# Patient Record
Sex: Female | Born: 1945 | Race: White | Hispanic: No | Smoking: Former smoker
Health system: Southern US, Community
[De-identification: ages and names within clinical notes are randomized; demographics above are authoritative.]

## PROBLEM LIST (undated history)

## (undated) DIAGNOSIS — E119 Type 2 diabetes mellitus without complications: Secondary | ICD-10-CM

## (undated) DIAGNOSIS — J449 Chronic obstructive pulmonary disease, unspecified: Secondary | ICD-10-CM

## (undated) DIAGNOSIS — C801 Malignant (primary) neoplasm, unspecified: Secondary | ICD-10-CM

## (undated) DIAGNOSIS — E785 Hyperlipidemia, unspecified: Secondary | ICD-10-CM

## (undated) DIAGNOSIS — F419 Anxiety disorder, unspecified: Secondary | ICD-10-CM

## (undated) DIAGNOSIS — R0600 Dyspnea, unspecified: Secondary | ICD-10-CM

## (undated) DIAGNOSIS — K219 Gastro-esophageal reflux disease without esophagitis: Secondary | ICD-10-CM

## (undated) DIAGNOSIS — I10 Essential (primary) hypertension: Secondary | ICD-10-CM

## (undated) HISTORY — PX: VAGINAL HYSTERECTOMY: SUR661

## (undated) HISTORY — DX: Anxiety disorder, unspecified: F41.9

## (undated) HISTORY — DX: Hyperlipidemia, unspecified: E78.5

## (undated) HISTORY — PX: CYSTOCELE REPAIR: SHX163

## (undated) HISTORY — DX: Essential (primary) hypertension: I10

## (undated) HISTORY — DX: Type 2 diabetes mellitus without complications: E11.9

---

## 1969-08-09 DIAGNOSIS — C801 Malignant (primary) neoplasm, unspecified: Secondary | ICD-10-CM

## 1969-08-09 HISTORY — PX: OOPHORECTOMY: SHX86

## 1969-08-09 HISTORY — DX: Malignant (primary) neoplasm, unspecified: C80.1

## 2004-09-16 ENCOUNTER — Emergency Department: Payer: Self-pay | Admitting: Emergency Medicine

## 2005-05-20 ENCOUNTER — Ambulatory Visit: Payer: Self-pay | Admitting: Family Medicine

## 2005-12-15 ENCOUNTER — Emergency Department: Payer: Self-pay | Admitting: Emergency Medicine

## 2006-05-03 ENCOUNTER — Ambulatory Visit: Payer: Self-pay | Admitting: Family Medicine

## 2009-08-09 HISTORY — PX: BREAST BIOPSY: SHX20

## 2009-12-30 ENCOUNTER — Ambulatory Visit: Payer: Self-pay | Admitting: Family Medicine

## 2011-03-29 ENCOUNTER — Ambulatory Visit: Payer: Self-pay | Admitting: Vascular Surgery

## 2011-12-01 ENCOUNTER — Ambulatory Visit: Payer: Self-pay | Admitting: Family Medicine

## 2012-06-12 ENCOUNTER — Ambulatory Visit: Payer: Self-pay | Admitting: Family Medicine

## 2012-12-28 ENCOUNTER — Ambulatory Visit: Payer: Self-pay | Admitting: Family Medicine

## 2013-01-31 ENCOUNTER — Ambulatory Visit: Payer: Self-pay | Admitting: Neurology

## 2013-04-08 ENCOUNTER — Ambulatory Visit: Payer: Self-pay | Admitting: Family Medicine

## 2013-04-10 ENCOUNTER — Ambulatory Visit: Payer: Self-pay | Admitting: Family Medicine

## 2013-06-25 DIAGNOSIS — Z961 Presence of intraocular lens: Secondary | ICD-10-CM | POA: Insufficient documentation

## 2013-06-25 DIAGNOSIS — G609 Hereditary and idiopathic neuropathy, unspecified: Secondary | ICD-10-CM | POA: Insufficient documentation

## 2013-06-25 DIAGNOSIS — H409 Unspecified glaucoma: Secondary | ICD-10-CM | POA: Insufficient documentation

## 2013-06-25 DIAGNOSIS — E785 Hyperlipidemia, unspecified: Secondary | ICD-10-CM | POA: Insufficient documentation

## 2013-06-25 DIAGNOSIS — C55 Malignant neoplasm of uterus, part unspecified: Secondary | ICD-10-CM | POA: Insufficient documentation

## 2014-02-22 ENCOUNTER — Ambulatory Visit: Payer: Self-pay | Admitting: Family Medicine

## 2014-03-15 ENCOUNTER — Ambulatory Visit: Payer: Self-pay | Admitting: Gastroenterology

## 2014-03-19 LAB — PATHOLOGY REPORT

## 2014-04-03 ENCOUNTER — Ambulatory Visit: Payer: Self-pay | Admitting: Gastroenterology

## 2014-04-11 ENCOUNTER — Ambulatory Visit: Payer: Self-pay | Admitting: Emergency Medicine

## 2014-10-17 ENCOUNTER — Ambulatory Visit: Payer: Self-pay | Admitting: Gastroenterology

## 2014-10-17 HISTORY — PX: COLONOSCOPY: SHX174

## 2015-04-30 DIAGNOSIS — L301 Dyshidrosis [pompholyx]: Secondary | ICD-10-CM | POA: Insufficient documentation

## 2015-05-08 ENCOUNTER — Other Ambulatory Visit: Payer: Self-pay | Admitting: Family Medicine

## 2015-05-08 DIAGNOSIS — Z1231 Encounter for screening mammogram for malignant neoplasm of breast: Secondary | ICD-10-CM

## 2015-05-09 ENCOUNTER — Other Ambulatory Visit: Payer: Self-pay | Admitting: Family Medicine

## 2015-05-09 ENCOUNTER — Ambulatory Visit
Admission: RE | Admit: 2015-05-09 | Discharge: 2015-05-09 | Disposition: A | Discharge status: Home / Self Care | Payer: Medicare HMO | Source: Ambulatory Visit | Attending: Family Medicine | Admitting: Family Medicine

## 2015-05-09 DIAGNOSIS — Z1231 Encounter for screening mammogram for malignant neoplasm of breast: Secondary | ICD-10-CM | POA: Insufficient documentation

## 2015-06-16 ENCOUNTER — Ambulatory Visit (INDEPENDENT_AMBULATORY_CARE_PROVIDER_SITE_OTHER): Payer: Medicare HMO | Admitting: Family Medicine

## 2015-06-16 ENCOUNTER — Encounter: Payer: Self-pay | Admitting: Family Medicine

## 2015-06-16 VITALS — BP 140/70 | HR 72 | Ht 65.0 in | Wt 190.0 lb

## 2015-06-16 DIAGNOSIS — E119 Type 2 diabetes mellitus without complications: Secondary | ICD-10-CM | POA: Diagnosis not present

## 2015-06-16 DIAGNOSIS — F419 Anxiety disorder, unspecified: Secondary | ICD-10-CM | POA: Diagnosis not present

## 2015-06-16 DIAGNOSIS — M1990 Unspecified osteoarthritis, unspecified site: Secondary | ICD-10-CM | POA: Diagnosis not present

## 2015-06-16 DIAGNOSIS — Z7189 Other specified counseling: Secondary | ICD-10-CM

## 2015-06-16 DIAGNOSIS — E785 Hyperlipidemia, unspecified: Secondary | ICD-10-CM

## 2015-06-16 DIAGNOSIS — I10 Essential (primary) hypertension: Secondary | ICD-10-CM

## 2015-06-16 DIAGNOSIS — Z7689 Persons encountering health services in other specified circumstances: Secondary | ICD-10-CM

## 2015-06-16 MED ORDER — LOSARTAN POTASSIUM 100 MG PO TABS: 100.0000 mg | ORAL_TABLET | Freq: Every day | ORAL | Status: DC

## 2015-06-16 MED ORDER — FENOFIBRATE 160 MG PO TABS: 160.0000 mg | ORAL_TABLET | Freq: Every day | ORAL | Status: DC

## 2015-06-16 MED ORDER — LORAZEPAM 0.5 MG PO TABS: 0.5000 mg | ORAL_TABLET | Freq: Every day | ORAL | Status: DC

## 2015-06-16 MED ORDER — OMEGA-3 300 MG PO CAPS: 1.0000 | ORAL_CAPSULE | Freq: Every day | ORAL | Status: DC

## 2015-06-16 MED ORDER — AMLODIPINE BESYLATE 10 MG PO TABS: 10.0000 mg | ORAL_TABLET | Freq: Every day | ORAL | Status: DC

## 2015-06-16 MED ORDER — LOVASTATIN 40 MG PO TABS: 40.0000 mg | ORAL_TABLET | Freq: Every day | ORAL | Status: DC

## 2015-06-16 MED ORDER — CARVEDILOL 25 MG PO TABS: 25.0000 mg | ORAL_TABLET | Freq: Two times a day (BID) | ORAL | Status: DC

## 2015-06-16 MED ORDER — GLIPIZIDE ER 2.5 MG PO TB24: 2.5000 mg | ORAL_TABLET | Freq: Every day | ORAL | Status: DC

## 2015-06-16 MED ORDER — METFORMIN HCL 1000 MG PO TABS: 1000.0000 mg | ORAL_TABLET | Freq: Two times a day (BID) | ORAL | Status: DC

## 2015-06-16 NOTE — Progress Notes (Signed)
Name: Sara Atkinson   MRN: [id number]    DOB: 08/19/1945   Date:06/16/2015       Progress Note  Subjective  Chief Complaint  Chief Complaint  Patient presents with  . Establish Care  . Diabetes    diarrhea not better since Dr cut Glipizide in half- Last A1C- 6.8    Diabetes She presents for her follow-up diabetic visit. She has type 2 diabetes mellitus. Her disease course has been stable. Hypoglycemia symptoms include nervousness/anxiousness. Pertinent negatives for hypoglycemia include no dizziness or headaches. There are no diabetic associated symptoms. Pertinent negatives for diabetes include no blurred vision, no chest pain, no polydipsia and no weight loss. There are no hypoglycemic complications. Symptoms are stable. There are no diabetic complications. Pertinent negatives for diabetic complications include no CVA, PVD or retinopathy. Current diabetic treatment includes oral agent (dual therapy). She is compliant with treatment most of the time. Her weight is stable. She is following a generally healthy diet. She participates in exercise intermittently. There is no change in her home blood glucose trend. Her breakfast blood glucose is taken between 8-9 am. Her breakfast blood glucose range is generally 130-140 mg/dl. An ACE inhibitor/angiotensin II receptor blocker is being taken. She does not see a podiatrist.Eye exam is current.  Hypertension This is a chronic problem. The current episode started more than 1 year ago. The problem has been waxing and waning since onset. The problem is controlled. Associated symptoms include anxiety. Pertinent negatives include no blurred vision, chest pain, headaches, malaise/fatigue, neck pain, orthopnea, palpitations, peripheral edema, PND or shortness of breath. There are no associated agents to hypertension. There are no known risk factors for coronary artery disease. Past treatments include calcium channel blockers and angiotensin blockers. The current  treatment provides moderate improvement. There are no compliance problems.  There is no history of angina, kidney disease, CAD/MI, CVA, heart failure, left ventricular hypertrophy, PVD or retinopathy. There is no history of chronic renal disease or a hypertension causing med.  Hyperlipidemia This is a chronic problem. The current episode started more than 1 year ago. The problem is controlled. Recent lipid tests were reviewed and are low. She has no history of chronic renal disease, diabetes, hypothyroidism, liver disease, obesity or nephrotic syndrome. There are no known factors aggravating her hyperlipidemia. Pertinent negatives include no chest pain, focal sensory loss, focal weakness, leg pain, myalgias or shortness of breath. The current treatment provides moderate improvement of lipids. There are no compliance problems.  There are no known risk factors for coronary artery disease.  Leg Pain  The incident occurred more than 1 week ago. There was no injury mechanism. The pain is present in the left hip, right hip, right knee and left knee. The quality of the pain is described as aching. Pertinent negatives include no inability to bear weight, loss of sensation or tingling. She has tried acetaminophen and NSAIDs for the symptoms. The treatment provided moderate relief.  Anxiety Onset was more than 5 years ago. Symptoms include insomnia and nervous/anxious behavior. Patient reports no chest pain, dizziness, feeling of choking, irritability, nausea, palpitations, shortness of breath or suicidal ideas. Symptoms occur occasionally. The severity of symptoms is mild. Nothing aggravates the symptoms.   There is no history of anemia, anxiety/panic attacks, arrhythmia, asthma, bipolar disorder, CAD, CHF, chronic lung disease, depression, fibromyalgia, hyperthyroidism or suicide attempts. Past treatments include benzodiazephines. Compliance with prior treatments has been good.    No problem-specific assessment &  plan notes found for  this encounter.   Past Medical History  Diagnosis Date  . Diabetes mellitus without complication ([place])   . Hypertension   . Hyperlipidemia   . Anxiety     Past Surgical History  Procedure Laterality Date  . Vaginal hysterectomy    . Cystocele repair    . Colonoscopy  10/17/2014    cleared for 3 years/ polyps- Dr Allen Norris    Family History  Problem Relation Age of Onset  . Cancer Mother   . Diabetes Mother   . Diabetes Father   . Diabetes Maternal Grandfather   . Stroke Maternal Grandfather   . Diabetes Paternal Grandfather     Social History   Social History  . Marital Status: Widowed    Spouse Name: N/A  . Number of Children: N/A  . Years of Education: N/A   Occupational History  . Not on file.   Social History Main Topics  . Smoking status: Former Research scientist (life sciences)  . Smokeless tobacco: Not on file  . Alcohol Use: 0.0 oz/week    0 Standard drinks or equivalent per week  . Drug Use: No  . Sexual Activity: No   Other Topics Concern  . Not on file   Social History Narrative    Allergies  Allergen Reactions  . Metronidazole Anaphylaxis  . Penicillins Anaphylaxis and Hives  . Tetracyclines & Related Hives     Review of Systems  Constitutional: Negative for fever, chills, weight loss, malaise/fatigue and irritability.  HENT: Negative for ear discharge, ear pain and sore throat.   Eyes: Negative for blurred vision.  Respiratory: Negative for cough, sputum production, shortness of breath and wheezing.   Cardiovascular: Negative for chest pain, palpitations, orthopnea, leg swelling and PND.  Gastrointestinal: Negative for heartburn, nausea, abdominal pain, diarrhea, constipation, blood in stool and melena.  Genitourinary: Negative for dysuria, urgency, frequency and hematuria.  Musculoskeletal: Positive for back pain and joint pain. Negative for myalgias and neck pain.  Skin: Negative for rash.  Neurological: Negative for dizziness, tingling,  sensory change, focal weakness and headaches.  Endo/Heme/Allergies: Negative for environmental allergies and polydipsia. Does not bruise/bleed easily.  Psychiatric/Behavioral: Negative for depression and suicidal ideas. The patient is nervous/anxious and has insomnia.      Objective  Filed Vitals:   06/16/15 1439  BP: 140/70  Pulse: 72  Height: 5\' 5"  (1.651 m)  Weight: 190 lb (86.183 kg)    Physical Exam  Constitutional: She is well-developed, well-nourished, and in no distress. No distress.  HENT:  Head: Normocephalic and atraumatic.  Right Ear: External ear normal.  Left Ear: External ear normal.  Nose: Nose normal.  Mouth/Throat: Oropharynx is clear and moist.  Eyes: Conjunctivae and EOM are normal. Pupils are equal, round, and reactive to light. Right eye exhibits no discharge. Left eye exhibits no discharge.  Neck: Normal range of motion. Neck supple. No JVD present. No thyromegaly present.  Cardiovascular: Normal rate, regular rhythm, normal heart sounds and intact distal pulses.  Exam reveals no gallop and no friction rub.   No murmur heard. Pulmonary/Chest: Effort normal and breath sounds normal.  Abdominal: Soft. Bowel sounds are normal. She exhibits no mass. There is no tenderness. There is no guarding.  Musculoskeletal: Normal range of motion. She exhibits no edema.  Lymphadenopathy:    She has no cervical adenopathy.  Neurological: She is alert. She has normal reflexes.  Skin: Skin is warm and dry. She is not diaphoretic.  Psychiatric: Mood and affect normal.  Assessment & Plan  Problem List Items Addressed This Visit    None    Visit Diagnoses    Encounter to establish care with new doctor    -  Primary    Type 2 diabetes mellitus without complication, without long-term current use of insulin ([facility])        Relevant Medications    aspirin 81 MG tablet    glipiZIDE (GLUCOTROL XL) 2.5 MG 24 hr tablet    losartan (COZAAR) 100 MG tablet    lovastatin  (MEVACOR) 40 MG tablet    metFORMIN (GLUCOPHAGE) 1000 MG tablet    Essential hypertension        Relevant Medications    aspirin 81 MG tablet    amLODipine (NORVASC) 10 MG tablet    carvedilol (COREG) 25 MG tablet    fenofibrate 160 MG tablet    losartan (COZAAR) 100 MG tablet    lovastatin (MEVACOR) 40 MG tablet    Hyperlipidemia        Relevant Medications    aspirin 81 MG tablet    amLODipine (NORVASC) 10 MG tablet    carvedilol (COREG) 25 MG tablet    fenofibrate 160 MG tablet    losartan (COZAAR) 100 MG tablet    lovastatin (MEVACOR) 40 MG tablet    Omega-3 300 MG CAPS    Osteoarthritis, unspecified osteoarthritis type, unspecified site        Relevant Medications    naproxen (NAPROSYN) 500 MG tablet    aspirin 81 MG tablet    Chronic anxiety        Relevant Medications    LORazepam (ATIVAN) 0.5 MG tablet       More than 45 minutes was spent counseling the patient on medication use and the dangers of prescribing long term anxiety medications. Also discussing her current issues with diabetic medication. I went over her bone density scan that was ordered and performed by her last doctor- pt was informed to continue taking Vitamin D and repeat scan in 2 years.  Dr. [facility]  06/16/2015

## 2015-08-15 DIAGNOSIS — H04123 Dry eye syndrome of bilateral lacrimal glands: Secondary | ICD-10-CM | POA: Diagnosis not present

## 2015-08-15 DIAGNOSIS — H40003 Preglaucoma, unspecified, bilateral: Secondary | ICD-10-CM | POA: Diagnosis not present

## 2015-08-28 ENCOUNTER — Ambulatory Visit (INDEPENDENT_AMBULATORY_CARE_PROVIDER_SITE_OTHER): Payer: Medicare Other | Admitting: Family Medicine

## 2015-08-28 ENCOUNTER — Encounter: Payer: Self-pay | Admitting: Family Medicine

## 2015-08-28 VITALS — BP 120/64 | HR 72 | Temp 98.4°F | Ht 65.0 in | Wt 190.0 lb

## 2015-08-28 DIAGNOSIS — J0100 Acute maxillary sinusitis, unspecified: Secondary | ICD-10-CM

## 2015-08-28 DIAGNOSIS — J40 Bronchitis, not specified as acute or chronic: Secondary | ICD-10-CM

## 2015-08-28 MED ORDER — AZITHROMYCIN 250 MG PO TABS: ORAL_TABLET | ORAL | Status: DC

## 2015-08-28 MED ORDER — GUAIFENESIN-CODEINE 100-10 MG/5ML PO SOLN: 5.0000 mL | Freq: Three times a day (TID) | ORAL | Status: DC | PRN

## 2015-08-28 NOTE — Progress Notes (Signed)
Name: Sara Atkinson   MRN: [id number]    DOB: 1946-01-06   Date:08/28/2015       Progress Note  Subjective  Chief Complaint  Chief Complaint  Patient presents with  . Sinusitis    cough and cong- head pressure    Sinusitis This is a new problem. The current episode started in the past 7 days. The problem has been gradually worsening since onset. There has been no fever. She is experiencing no pain. Associated symptoms include congestion, coughing, shortness of breath, sinus pressure, sneezing and a sore throat. Pertinent negatives include no chills, diaphoresis, ear pain, headaches or neck pain. Treatments tried: expectorant/ dm. The treatment provided mild relief.  Cough This is a new problem. The current episode started in the past 7 days. The problem has been gradually worsening. The cough is productive of sputum and productive of purulent sputum (yellow tint). Associated symptoms include postnasal drip, a sore throat and shortness of breath. Pertinent negatives include no chest pain, chills, ear congestion, ear pain, fever, headaches, heartburn, myalgias, rash, weight loss or wheezing. She has tried OTC cough suppressant for the symptoms. The treatment provided no relief. There is no history of environmental allergies.    No problem-specific assessment & plan notes found for this encounter.   Past Medical History  Diagnosis Date  . Diabetes mellitus without complication ([place])   . Hypertension   . Hyperlipidemia   . Anxiety     Past Surgical History  Procedure Laterality Date  . Vaginal hysterectomy    . Cystocele repair    . Colonoscopy  10/17/2014    cleared for 3 years/ polyps- Dr Allen Norris    Family History  Problem Relation Age of Onset  . Cancer Mother   . Diabetes Mother   . Diabetes Father   . Diabetes Maternal Grandfather   . Stroke Maternal Grandfather   . Diabetes Paternal Grandfather     Social History   Social History  . Marital Status: Widowed    Spouse  Name: N/A  . Number of Children: N/A  . Years of Education: N/A   Occupational History  . Not on file.   Social History Main Topics  . Smoking status: Former Research scientist (life sciences)  . Smokeless tobacco: Not on file  . Alcohol Use: 0.0 oz/week    0 Standard drinks or equivalent per week  . Drug Use: No  . Sexual Activity: No   Other Topics Concern  . Not on file   Social History Narrative    Allergies  Allergen Reactions  . Metronidazole Anaphylaxis  . Penicillins Anaphylaxis and Hives  . Tetracyclines & Related Hives     Review of Systems  Constitutional: Negative for fever, chills, weight loss, malaise/fatigue and diaphoresis.  HENT: Positive for congestion, postnasal drip, sinus pressure, sneezing and sore throat. Negative for ear discharge and ear pain.   Eyes: Negative for blurred vision.  Respiratory: Positive for cough and shortness of breath. Negative for sputum production and wheezing.   Cardiovascular: Negative for chest pain, palpitations and leg swelling.  Gastrointestinal: Negative for heartburn, nausea, abdominal pain, diarrhea, constipation, blood in stool and melena.  Genitourinary: Negative for dysuria, urgency, frequency and hematuria.  Musculoskeletal: Negative for myalgias, back pain, joint pain and neck pain.  Skin: Negative for rash.  Neurological: Negative for dizziness, tingling, sensory change, focal weakness and headaches.  Endo/Heme/Allergies: Negative for environmental allergies and polydipsia. Does not bruise/bleed easily.  Psychiatric/Behavioral: Negative for depression and suicidal ideas. The patient  is not nervous/anxious and does not have insomnia.      Objective  Filed Vitals:   08/28/15 1523  BP: 120/64  Pulse: 72  Temp: 98.4 F (36.9 C)  TempSrc: Oral  Height: 5\' 5"  (1.651 m)  Weight: 190 lb (86.183 kg)    Physical Exam  Constitutional: She is well-developed, well-nourished, and in no distress. No distress.  HENT:  Head: Normocephalic  and atraumatic.  Right Ear: External ear normal.  Left Ear: External ear normal.  Nose: Nose normal.  Mouth/Throat: Oropharynx is clear and moist.  Eyes: Conjunctivae and EOM are normal. Pupils are equal, round, and reactive to light. Right eye exhibits no discharge. Left eye exhibits no discharge.  Neck: Normal range of motion. Neck supple. No JVD present. No thyromegaly present.  Cardiovascular: Normal rate, regular rhythm, normal heart sounds and intact distal pulses.  Exam reveals no gallop and no friction rub.   No murmur heard. Pulmonary/Chest: Effort normal and breath sounds normal.  Abdominal: Soft. Bowel sounds are normal. She exhibits no mass. There is no tenderness. There is no guarding.  Musculoskeletal: Normal range of motion. She exhibits no edema.  Lymphadenopathy:    She has no cervical adenopathy.  Neurological: She is alert.  Skin: Skin is warm and dry. She is not diaphoretic.  Psychiatric: Mood and affect normal.      Assessment & Plan  Problem List Items Addressed This Visit    None    Visit Diagnoses    Acute maxillary sinusitis, recurrence not specified    -  Primary    Relevant Medications    azithromycin (ZITHROMAX) 250 MG tablet    guaiFENesin-codeine 100-10 MG/5ML syrup    Bronchitis        Relevant Medications    azithromycin (ZITHROMAX) 250 MG tablet    guaiFENesin-codeine 100-10 MG/5ML syrup         Dr. Jerrett [facility]  08/28/2015

## 2015-10-14 ENCOUNTER — Ambulatory Visit (INDEPENDENT_AMBULATORY_CARE_PROVIDER_SITE_OTHER): Payer: Medicare Other | Admitting: Family Medicine

## 2015-10-14 ENCOUNTER — Encounter: Payer: Self-pay | Admitting: Family Medicine

## 2015-10-14 VITALS — BP 124/80 | HR 80 | Ht 65.0 in | Wt 190.0 lb

## 2015-10-14 DIAGNOSIS — Z0000 Encounter for general adult medical examination without abnormal findings: Secondary | ICD-10-CM

## 2015-10-14 DIAGNOSIS — E119 Type 2 diabetes mellitus without complications: Secondary | ICD-10-CM

## 2015-10-14 DIAGNOSIS — Z23 Encounter for immunization: Secondary | ICD-10-CM

## 2015-10-14 MED ORDER — GLIPIZIDE ER 5 MG PO TB24: 5.0000 mg | ORAL_TABLET | Freq: Every day | ORAL | Status: DC

## 2015-10-14 NOTE — Progress Notes (Signed)
Patient: Sara Atkinson, Female    DOB: [birth year], 70 y.o.   MRN: [id number] Visit Date: 10/14/2015  Today's Provider: Otilio Miu, MD   Chief Complaint  Patient presents with  . Annual Exam    medicare annual wellness   Subjective:   Initial preventative physical exam Sara Atkinson is a 70 y.o. female who presents today for her Initial Preventative Physical Exam. She feels well. She reports exercising daily walking. She reports she is sleeping fairly well.  HPI Comments: Patient presents for Medicare annual Wellness.    Review of Systems  Constitutional: Negative.  Negative for fever, chills, fatigue and unexpected weight change.  HENT: Negative for congestion, ear discharge, ear pain, rhinorrhea, sinus pressure, sneezing and sore throat.   Eyes: Negative for photophobia, pain, discharge, redness and itching.  Respiratory: Negative for cough, shortness of breath, wheezing and stridor.   Gastrointestinal: Negative for nausea, vomiting, abdominal pain, diarrhea, constipation and blood in stool.  Endocrine: Negative for cold intolerance, heat intolerance, polydipsia, polyphagia and polyuria.  Genitourinary: Negative for dysuria, urgency, frequency, hematuria, flank pain, vaginal bleeding, vaginal discharge, menstrual problem and pelvic pain.  Musculoskeletal: Negative for myalgias, back pain and arthralgias.  Skin: Negative for rash.  Allergic/Immunologic: Negative for environmental allergies and food allergies.  Neurological: Negative for dizziness, weakness, light-headedness, numbness and headaches.  Hematological: Negative for adenopathy. Does not bruise/bleed easily.  Psychiatric/Behavioral: Negative for dysphoric mood. The patient is not nervous/anxious.     Social History   Social History  . Marital Status: Widowed    Spouse Name: N/A  . Number of Children: N/A  . Years of Education: N/A   Occupational History  . Not on file.   Social History Main Topics  . Smoking  status: Former Research scientist (life sciences)  . Smokeless tobacco: Not on file  . Alcohol Use: 0.0 oz/week    0 Standard drinks or equivalent per week  . Drug Use: No  . Sexual Activity: No   Other Topics Concern  . Not on file   Social History Narrative    There are no active problems to display for this patient.   Past Surgical History  Procedure Laterality Date  . Vaginal hysterectomy    . Cystocele repair    . Colonoscopy  10/17/2014    cleared for 3 years/ polyps- Dr Allen Norris    Her family history includes Cancer in her mother; Diabetes in her father, maternal grandfather, mother, and paternal grandfather; Stroke in her maternal grandfather.    Previous Medications   AMLODIPINE (NORVASC) 10 MG TABLET    Take 1 tablet (10 mg total) by mouth daily.   ASPIRIN 81 MG TABLET    Take 81 mg by mouth daily.   BIOTIN 5000 PO    Take 1 capsule by mouth daily.   CARVEDILOL (COREG) 25 MG TABLET    Take 1 tablet (25 mg total) by mouth 2 (two) times daily.   CHOLECALCIFEROL (VITAMIN D3) 5000 UNITS CAPS    Take 1 capsule by mouth daily.   FENOFIBRATE 160 MG TABLET    Take 1 tablet (160 mg total) by mouth daily.   LORAZEPAM (ATIVAN) 0.5 MG TABLET    Take 1 tablet (0.5 mg total) by mouth daily. As needed. #25 per 60 days   LOSARTAN (COZAAR) 100 MG TABLET    Take 1 tablet (100 mg total) by mouth daily.   LOVASTATIN (MEVACOR) 40 MG TABLET    Take 1 tablet (40 mg total) by mouth daily.  METFORMIN (GLUCOPHAGE) 1000 MG TABLET    Take 1 tablet (1,000 mg total) by mouth 2 (two) times daily.   NAPROXEN (NAPROSYN) 500 MG TABLET    Take 500 mg by mouth as needed.   OMEGA-3 300 MG CAPS    Take 1 capsule by mouth daily.    Patient Care Team: Juline Patch, MD as PCP - General (Family Medicine)     Objective:   Vitals: BP 124/80 mmHg  Pulse 80  Ht 5\' 5"  (1.651 m)  Wt 190 lb (86.183 kg)  BMI 31.62 kg/m2  Physical Exam  Constitutional: She is oriented to person, place, and time. She appears well-developed and  well-nourished.  HENT:  Head: Normocephalic.  Right Ear: External ear normal.  Left Ear: External ear normal.  Mouth/Throat: Oropharynx is clear and moist.  Eyes: Conjunctivae and EOM are normal. Pupils are equal, round, and reactive to light. Lids are everted and swept, no foreign bodies found. Left eye exhibits no hordeolum. No foreign body present in the left eye. Right conjunctiva is not injected. Left conjunctiva is not injected. No scleral icterus.  Neck: Normal range of motion. Neck supple. No JVD present. No tracheal deviation present. No thyromegaly present.  Cardiovascular: Normal rate, regular rhythm, normal heart sounds and intact distal pulses.  Exam reveals no gallop and no friction rub.   No murmur heard. Pulmonary/Chest: Effort normal and breath sounds normal. No respiratory distress. She has no wheezes. She has no rales.  Abdominal: Soft. Bowel sounds are normal. She exhibits no mass. There is no hepatosplenomegaly. There is no tenderness. There is no rebound and no guarding.  Musculoskeletal: Normal range of motion. She exhibits no edema or tenderness.  Lymphadenopathy:    She has no cervical adenopathy.  Neurological: She is alert and oriented to person, place, and time. She has normal strength. She displays normal reflexes. No cranial nerve deficit.  Skin: Skin is warm. No rash noted.  Psychiatric: She has a normal mood and affect. Thought content normal. Her mood appears not anxious. She does not exhibit a depressed mood.  Nursing note and vitals reviewed.    No exam data present  Activities of Daily Living In your present state of health, do you have any difficulty performing the following activities: 10/14/2015 08/28/2015  Hearing? N N  Vision? N N  Difficulty concentrating or making decisions? N N  Walking or climbing stairs? N N  Dressing or bathing? N N  Doing errands, shopping? N N    Fall Risk Assessment Fall Risk  10/14/2015 08/28/2015 06/16/2015  Falls in  the past year? No Yes Yes  Number falls in past yr: - 1 1  Injury with Fall? - No No  Follow up - Falls evaluation completed Falls evaluation completed     Patient reports there are safety devices in place in shower at home.   Depression Screen PHQ 2/9 Scores 10/14/2015 08/28/2015 06/16/2015  PHQ - 2 Score 0 0 0    Cognitive Testing - 6-CIT   Correct? Score   What year is it? yes 0 Yes = 0    No = 4  What month is it? yes 0 Yes = 0    No = 3  Remember:     [address]ia [address], [place], [place]     What time is it? yes 0 Yes = 0    No = 3  Count backwards from 20 to 1 yes 0 Correct = 0  1 error = 2   More than 1 error = 4  Say the months of the year in reverse. yes 0 Correct = 0    1 error = 2   More than 1 error = 4  What address did I ask you to remember? yes 0 Correct = 0  1 error = 2    2 error = 4    3 error = 6    4 error = 8    All wrong = 10       TOTAL SCORE  0/28   Interpretation:  Normal  Normal (0-7) Abnormal (8-28)     Assessment & Plan:     Initial Preventative Physical Exam  Reviewed patient's Family Medical History Reviewed and updated list of patient's medical providers Assessment of cognitive impairment was done Assessed patient's functional ability Established a written schedule for health screening [facility] Completed and Reviewed  Exercise Activities and Dietary recommendations Goals    None      Immunization History  Administered Date(s) Administered  . Pneumococcal Conjugate-13 10/14/2015    Health Maintenance  Topic Date Due  . HEMOGLOBIN A1C  [birth year]  . Hepatitis C Screening  11-Nov-1945  . FOOT EXAM  06/26/1955  . PNA vac Low Risk Adult (1 of 2 - PCV13) 06/25/2010  . INFLUENZA VACCINE  03/09/2016  . OPHTHALMOLOGY EXAM  08/07/2016  . MAMMOGRAM  05/08/2017  . TETANUS/TDAP  08/09/2022  . COLONOSCOPY  10/16/2024  . DEXA SCAN  Completed  . ZOSTAVAX  Addressed      Discussed health benefits of physical  activity, and encouraged her to engage in regular exercise appropriate for her age and condition.    ------------------------------------------------------------------------------------------------------------   Problem List Items Addressed This Visit    None    Visit Diagnoses    Encounter for Medicare annual wellness exam    -  Primary    Type 2 diabetes mellitus without complication, without long-term current use of insulin ([place])        Relevant Medications    glipiZIDE (GLUCOTROL XL) 5 MG 24 hr tablet    Need for pneumococcal vaccination        Relevant Orders    Pneumococcal conjugate vaccine 13-valent (Completed)        Otilio Miu, MD [facility]  10/14/2015

## 2015-10-14 NOTE — Patient Instructions (Signed)

## 2015-10-20 ENCOUNTER — Other Ambulatory Visit: Payer: Self-pay

## 2015-11-25 ENCOUNTER — Ambulatory Visit (INDEPENDENT_AMBULATORY_CARE_PROVIDER_SITE_OTHER): Payer: Medicare Other | Admitting: Family Medicine

## 2015-11-25 ENCOUNTER — Encounter: Payer: Self-pay | Admitting: Family Medicine

## 2015-11-25 VITALS — BP 100/62 | HR 84 | Ht 65.0 in | Wt 190.0 lb

## 2015-11-25 DIAGNOSIS — F419 Anxiety disorder, unspecified: Secondary | ICD-10-CM

## 2015-11-25 DIAGNOSIS — E785 Hyperlipidemia, unspecified: Secondary | ICD-10-CM | POA: Diagnosis not present

## 2015-11-25 DIAGNOSIS — E119 Type 2 diabetes mellitus without complications: Secondary | ICD-10-CM

## 2015-11-25 DIAGNOSIS — I10 Essential (primary) hypertension: Secondary | ICD-10-CM

## 2015-11-25 MED ORDER — LORAZEPAM 0.5 MG PO TABS: 0.5000 mg | ORAL_TABLET | Freq: Every day | ORAL | Status: DC

## 2015-11-25 MED ORDER — FENOFIBRATE 160 MG PO TABS: 160.0000 mg | ORAL_TABLET | Freq: Every day | ORAL | Status: DC

## 2015-11-25 MED ORDER — METFORMIN HCL 1000 MG PO TABS: 1000.0000 mg | ORAL_TABLET | Freq: Two times a day (BID) | ORAL | Status: DC

## 2015-11-25 MED ORDER — LOSARTAN POTASSIUM 100 MG PO TABS: 100.0000 mg | ORAL_TABLET | Freq: Every day | ORAL | Status: DC

## 2015-11-25 MED ORDER — CARVEDILOL 25 MG PO TABS: 25.0000 mg | ORAL_TABLET | Freq: Two times a day (BID) | ORAL | Status: DC

## 2015-11-25 MED ORDER — LOVASTATIN 40 MG PO TABS: 40.0000 mg | ORAL_TABLET | Freq: Every day | ORAL | Status: DC

## 2015-11-25 MED ORDER — AMLODIPINE BESYLATE 10 MG PO TABS: 10.0000 mg | ORAL_TABLET | Freq: Every day | ORAL | Status: DC

## 2015-11-25 NOTE — Progress Notes (Signed)
Name: Sara Atkinson   MRN: [id number]    DOB: 07-24-1946   Date:11/25/2015       Progress Note  Subjective  Chief Complaint  Chief Complaint  Patient presents with  . Hypertension  . Hyperlipidemia  . Anxiety  . Diabetes    Hypertension This is a chronic problem. The current episode started more than 1 year ago. The problem has been gradually improving since onset. The problem is controlled. Associated symptoms include anxiety. Pertinent negatives include no blurred vision, chest pain, headaches, malaise/fatigue, neck pain, orthopnea, palpitations, peripheral edema, PND, shortness of breath or sweats. There are no associated agents to hypertension. Risk factors for coronary artery disease include diabetes mellitus, dyslipidemia, post-menopausal state and smoking/tobacco exposure. Past treatments include angiotensin blockers, beta blockers and calcium channel blockers. The current treatment provides no improvement. There are no compliance problems.  There is no history of angina, kidney disease, CAD/MI, CVA, heart failure, left ventricular hypertrophy, PVD, renovascular disease or retinopathy. There is no history of chronic renal disease or a hypertension causing med.  Hyperlipidemia This is a chronic problem. The current episode started more than 1 year ago. The problem is controlled. Recent lipid tests were reviewed and are normal. Exacerbating diseases include obesity. She has no history of chronic renal disease, diabetes, hypothyroidism, liver disease or nephrotic syndrome. There are no known factors aggravating her hyperlipidemia. Pertinent negatives include no chest pain, focal sensory loss, focal weakness, leg pain, myalgias or shortness of breath. Current antihyperlipidemic treatment includes statins and fibric acid derivatives. The current treatment provides moderate improvement of lipids. There are no compliance problems.  Risk factors for coronary artery disease include dyslipidemia,  obesity, hypertension and post-menopausal.  Anxiety Presents for follow-up visit. The problem has been gradually improving. Symptoms include nervous/anxious behavior. Patient reports no chest pain, confusion, decreased concentration, depressed mood, dizziness, excessive worry, feeling of choking, insomnia, irritability, malaise, muscle tension, nausea, obsessions, palpitations, panic, restlessness, shortness of breath or suicidal ideas. Symptoms occur occasionally.   Her past medical history is significant for anxiety/panic attacks. There is no history of anemia, arrhythmia, asthma, bipolar disorder, CAD, CHF, chronic lung disease, depression, fibromyalgia, hyperthyroidism or suicide attempts. Past treatments include benzodiazephines.  Diabetes She presents for her follow-up diabetic visit. She has type 2 diabetes mellitus. Her disease course has been stable. Hypoglycemia symptoms include nervousness/anxiousness. Pertinent negatives for hypoglycemia include no confusion, dizziness, headaches, hunger, mood changes, pallor, seizures, sleepiness, speech difficulty, sweats or tremors. Pertinent negatives for diabetes include no blurred vision, no chest pain, no fatigue, no foot paresthesias, no foot ulcerations, no polydipsia, no polyphagia, no polyuria, no visual change, no weakness and no weight loss. There are no hypoglycemic complications. Symptoms are stable. Pertinent negatives for diabetic complications include no CVA, PVD or retinopathy. Risk factors for coronary artery disease include dyslipidemia and obesity. She is following a generally healthy diet. She participates in exercise intermittently. Her home blood glucose trend is fluctuating dramatically. Her breakfast blood glucose is taken between 8-9 am. Her breakfast blood glucose range is generally 130-140 mg/dl. An ACE inhibitor/angiotensin II receptor blocker is being taken. She does not see a podiatrist.Eye exam is not current.    No  problem-specific assessment & plan notes found for this encounter.   Past Medical History  Diagnosis Date  . Diabetes mellitus without complication ([place])   . Hypertension   . Hyperlipidemia   . Anxiety     Past Surgical History  Procedure Laterality Date  . Vaginal hysterectomy    .  Cystocele repair    . Colonoscopy  10/17/2014    cleared for 3 years/ polyps- Dr Allen Norris    Family History  Problem Relation Age of Onset  . Cancer Mother   . Diabetes Mother   . Diabetes Father   . Diabetes Maternal Grandfather   . Stroke Maternal Grandfather   . Diabetes Paternal Grandfather     Social History   Social History  . Marital Status: Widowed    Spouse Name: N/A  . Number of Children: N/A  . Years of Education: N/A   Occupational History  . Not on file.   Social History Main Topics  . Smoking status: Former Research scientist (life sciences)  . Smokeless tobacco: Not on file  . Alcohol Use: 0.0 oz/week    0 Standard drinks or equivalent per week  . Drug Use: No  . Sexual Activity: No   Other Topics Concern  . Not on file   Social History Narrative    Allergies  Allergen Reactions  . Metronidazole Anaphylaxis  . Penicillins Anaphylaxis and Hives  . Tetracyclines & Related Hives     Review of Systems  Constitutional: Negative for fever, chills, weight loss, malaise/fatigue, irritability and fatigue.  HENT: Negative for ear discharge, ear pain and sore throat.   Eyes: Negative for blurred vision.  Respiratory: Negative for cough, sputum production, shortness of breath and wheezing.   Cardiovascular: Negative for chest pain, palpitations, orthopnea, leg swelling and PND.  Gastrointestinal: Negative for heartburn, nausea, abdominal pain, diarrhea, constipation, blood in stool and melena.  Genitourinary: Negative for dysuria, urgency, frequency and hematuria.  Musculoskeletal: Negative for myalgias, back pain, joint pain and neck pain.  Skin: Negative for pallor and rash.  Neurological:  Negative for dizziness, tingling, tremors, sensory change, focal weakness, seizures, speech difficulty, weakness and headaches.  Endo/Heme/Allergies: Negative for environmental allergies, polydipsia and polyphagia. Does not bruise/bleed easily.  Psychiatric/Behavioral: Negative for depression, suicidal ideas, confusion and decreased concentration. The patient is nervous/anxious. The patient does not have insomnia.      Objective  Filed Vitals:   11/25/15 0937  BP: 100/62  Pulse: 84  Height: 5\' 5"  (1.651 m)  Weight: 190 lb (86.183 kg)    Physical Exam  Constitutional: She is well-developed, well-nourished, and in no distress. No distress.  HENT:  Head: Normocephalic and atraumatic.  Right Ear: External ear normal.  Left Ear: External ear normal.  Nose: Nose normal.  Mouth/Throat: Oropharynx is clear and moist.  Eyes: Conjunctivae and EOM are normal. Pupils are equal, round, and reactive to light. Right eye exhibits no discharge. Left eye exhibits no discharge.  Neck: Normal range of motion. Neck supple. No JVD present. No thyromegaly present.  Cardiovascular: Normal rate, regular rhythm, normal heart sounds and intact distal pulses.  Exam reveals no gallop and no friction rub.   No murmur heard. Pulmonary/Chest: Effort normal and breath sounds normal.  Abdominal: Soft. Bowel sounds are normal. She exhibits no mass. There is no tenderness. There is no guarding.  Musculoskeletal: Normal range of motion. She exhibits no edema.  Lymphadenopathy:    She has no cervical adenopathy.  Neurological: She is alert. She has normal motor skills and normal reflexes. A sensory deficit is present.  Abnormal monofilament bilateral  Skin: Skin is warm and dry. She is not diaphoretic.  Psychiatric: Mood and affect normal.  Nursing note and vitals reviewed.     Assessment & Plan  Problem List Items Addressed This Visit    None  Visit Diagnoses    Type 2 diabetes mellitus without  complication, without long-term current use of insulin (HCC)    -  Primary    Relevant Medications    metFORMIN (GLUCOPHAGE) 1000 MG tablet    lovastatin (MEVACOR) 40 MG tablet    losartan (COZAAR) 100 MG tablet    Other Relevant Orders    Hemoglobin A1c    Renal Function Panel    Microalbumin / creatinine urine ratio    Hyperlipidemia        Relevant Medications    lovastatin (MEVACOR) 40 MG tablet    losartan (COZAAR) 100 MG tablet    fenofibrate 160 MG tablet    carvedilol (COREG) 25 MG tablet    amLODipine (NORVASC) 10 MG tablet    Other Relevant Orders    Lipid Profile    Essential hypertension        Relevant Medications    lovastatin (MEVACOR) 40 MG tablet    losartan (COZAAR) 100 MG tablet    fenofibrate 160 MG tablet    carvedilol (COREG) 25 MG tablet    amLODipine (NORVASC) 10 MG tablet    Other Relevant Orders    Renal Function Panel    Chronic anxiety        Relevant Medications    LORazepam (ATIVAN) 0.5 MG tablet         Dr. Holdyn Poyser [facility]  11/25/2015

## 2015-11-26 LAB — MICROALBUMIN / CREATININE URINE RATIO
CREATININE, UR: 134.1 mg/dL
MICROALB/CREAT RATIO: 7.6 mg/g creat (ref 0.0–30.0)
Microalbumin, Urine: 10.2 ug/mL

## 2015-11-26 LAB — RENAL FUNCTION PANEL
Albumin: 4.6 g/dL (ref 3.5–4.8)
BUN/Creatinine Ratio: 20 (ref 12–28)
BUN: 14 mg/dL (ref 8–27)
CALCIUM: 9.4 mg/dL (ref 8.7–10.3)
CHLORIDE: 100 mmol/L (ref 96–106)
CO2: 22 mmol/L (ref 18–29)
CREATININE: 0.69 mg/dL (ref 0.57–1.00)
GFR calc Af Amer: 102 mL/min/{1.73_m2} (ref >59)
GFR calc non Af Amer: 88 mL/min/{1.73_m2} (ref >59)
Glucose: 138 mg/dL — ABNORMAL HIGH (ref 65–99)
Phosphorus: 4 mg/dL (ref 2.5–4.5)
Potassium: 4.9 mmol/L (ref 3.5–5.2)
SODIUM: 142 mmol/L (ref 134–144)

## 2015-11-26 LAB — HEMOGLOBIN A1C
Est. average glucose Bld gHb Est-mCnc: 177 mg/dL
Hgb A1c MFr Bld: 7.8 % — ABNORMAL HIGH (ref 4.8–5.6)

## 2015-11-26 LAB — LIPID PANEL
CHOL/HDL RATIO: 3.8 ratio (ref 0.0–4.4)
Cholesterol, Total: 162 mg/dL (ref 100–199)
HDL: 43 mg/dL (ref >39)
LDL Calculated: 87 mg/dL (ref 0–99)
Triglycerides: 162 mg/dL — ABNORMAL HIGH (ref 0–149)
VLDL Cholesterol Cal: 32 mg/dL (ref 5–40)

## 2015-11-26 MED ORDER — GLIPIZIDE ER 10 MG PO TB24: 10.0000 mg | ORAL_TABLET | Freq: Every day | ORAL | Status: DC

## 2015-11-26 NOTE — Addendum Note (Signed)
Addended by: Fredderick Severance on: 11/26/2015 08:12 AM   Modules accepted: Orders, Medications

## 2015-12-02 ENCOUNTER — Other Ambulatory Visit: Payer: Self-pay

## 2015-12-22 ENCOUNTER — Other Ambulatory Visit: Payer: Self-pay

## 2016-01-07 ENCOUNTER — Ambulatory Visit (INDEPENDENT_AMBULATORY_CARE_PROVIDER_SITE_OTHER): Payer: Medicare Other | Admitting: Family Medicine

## 2016-01-07 ENCOUNTER — Ambulatory Visit
Admission: RE | Admit: 2016-01-07 | Discharge: 2016-01-07 | Disposition: A | Discharge status: Home / Self Care | Payer: Medicare Other | Source: Ambulatory Visit | Attending: Family Medicine | Admitting: Family Medicine

## 2016-01-07 ENCOUNTER — Other Ambulatory Visit
Admission: RE | Admit: 2016-01-07 | Discharge: 2016-01-07 | Disposition: A | Discharge status: Home / Self Care | Payer: Medicare Other | Source: Ambulatory Visit | Attending: Family Medicine | Admitting: Family Medicine

## 2016-01-07 ENCOUNTER — Encounter: Payer: Self-pay | Admitting: Family Medicine

## 2016-01-07 VITALS — BP 120/68 | HR 88 | Ht 65.0 in | Wt 197.0 lb

## 2016-01-07 DIAGNOSIS — S8391XA Sprain of unspecified site of right knee, initial encounter: Secondary | ICD-10-CM | POA: Diagnosis not present

## 2016-01-07 DIAGNOSIS — X58XXXA Exposure to other specified factors, initial encounter: Secondary | ICD-10-CM | POA: Diagnosis not present

## 2016-01-07 DIAGNOSIS — S838X1A Sprain of other specified parts of right knee, initial encounter: Secondary | ICD-10-CM

## 2016-01-07 DIAGNOSIS — E119 Type 2 diabetes mellitus without complications: Secondary | ICD-10-CM | POA: Insufficient documentation

## 2016-01-07 DIAGNOSIS — S8991XA Unspecified injury of right lower leg, initial encounter: Secondary | ICD-10-CM | POA: Diagnosis not present

## 2016-01-07 LAB — HEMOGLOBIN A1C: Hgb A1c MFr Bld: 7 % — ABNORMAL HIGH (ref 4.0–6.0)

## 2016-01-07 MED ORDER — TRAMADOL HCL 50 MG PO TABS: 50.0000 mg | ORAL_TABLET | Freq: Three times a day (TID) | ORAL | Status: DC | PRN

## 2016-01-07 NOTE — Progress Notes (Signed)
Name: Sara Atkinson   MRN: [id number]    DOB: 1946/04/23   Date:01/07/2016       Progress Note  Subjective  Chief Complaint  Chief Complaint  Patient presents with  . Diabetes    recheck on A1C  . Knee Pain    R) knee pain radiating up into thigh- was doing water aerobics yesterday and then got into the hot tub for approx 15 min. When trying to get out, felt a "pop" in leg that "almost drop me to my knees"- leg/knee has been hurting since then    Diabetes She presents for her follow-up diabetic visit. She has type 2 diabetes mellitus. There are no hypoglycemic associated symptoms. Pertinent negatives for hypoglycemia include no dizziness, headaches, nervousness/anxiousness or sweats. Pertinent negatives for diabetes include no blurred vision, no chest pain, no fatigue, no foot paresthesias, no foot ulcerations, no polydipsia, no polyphagia, no polyuria, no visual change and no weight loss. There are no hypoglycemic complications. Symptoms are stable. She is following a generally healthy diet. She participates in exercise every other day. Her home blood glucose trend is fluctuating minimally. Her breakfast blood glucose is taken between 8-9 am. Her breakfast blood glucose range is generally 130-140 mg/dl. She does not see a podiatrist.Eye exam is current.  Knee Pain  The incident occurred 12 to 24 hours ago. The incident occurred at the gym. There was no injury mechanism. The pain is present in the right knee. The quality of the pain is described as aching. Associated symptoms include an inability to bear weight. Pertinent negatives include no loss of motion, loss of sensation, muscle weakness or tingling. She has tried NSAIDs for the symptoms. The treatment provided mild relief.    No problem-specific assessment & plan notes found for this encounter.   Past Medical History  Diagnosis Date  . Diabetes mellitus without complication ([place])   . Hypertension   . Hyperlipidemia   . Anxiety      Past Surgical History  Procedure Laterality Date  . Vaginal hysterectomy    . Cystocele repair    . Colonoscopy  10/17/2014    cleared for 3 years/ polyps- Dr Allen Norris    Family History  Problem Relation Age of Onset  . Cancer Mother   . Diabetes Mother   . Diabetes Father   . Diabetes Maternal Grandfather   . Stroke Maternal Grandfather   . Diabetes Paternal Grandfather     Social History   Social History  . Marital Status: Widowed    Spouse Name: N/A  . Number of Children: N/A  . Years of Education: N/A   Occupational History  . Not on file.   Social History Main Topics  . Smoking status: Former Research scientist (life sciences)  . Smokeless tobacco: Not on file  . Alcohol Use: 0.0 oz/week    0 Standard drinks or equivalent per week  . Drug Use: No  . Sexual Activity: No   Other Topics Concern  . Not on file   Social History Narrative    Allergies  Allergen Reactions  . Metronidazole Anaphylaxis  . Penicillins Anaphylaxis and Hives  . Tetracyclines & Related Hives     Review of Systems  Constitutional: Negative for fever, chills, weight loss, malaise/fatigue and fatigue.  HENT: Negative for ear discharge, ear pain and sore throat.   Eyes: Negative for blurred vision.  Respiratory: Negative for cough, sputum production, shortness of breath and wheezing.   Cardiovascular: Negative for chest pain, palpitations and leg  swelling.  Gastrointestinal: Negative for heartburn, nausea, abdominal pain, diarrhea, constipation, blood in stool and melena.  Genitourinary: Negative for dysuria, urgency, frequency and hematuria.  Musculoskeletal: Positive for joint pain. Negative for myalgias, back pain, falls and neck pain.  Skin: Negative for rash.  Neurological: Negative for dizziness, tingling, sensory change, focal weakness and headaches.  Endo/Heme/Allergies: Negative for environmental allergies, polydipsia and polyphagia. Does not bruise/bleed easily.  Psychiatric/Behavioral: Negative  for depression and suicidal ideas. The patient is not nervous/anxious and does not have insomnia.      Objective  Filed Vitals:   01/07/16 0816  BP: 120/68  Pulse: 88  Height: 5\' 5"  (1.651 m)  Weight: 197 lb (89.359 kg)    Physical Exam  Constitutional: She is well-developed, well-nourished, and in no distress. No distress.  HENT:  Head: Normocephalic and atraumatic.  Right Ear: External ear normal.  Left Ear: External ear normal.  Nose: Nose normal.  Mouth/Throat: Oropharynx is clear and moist.  Eyes: Conjunctivae and EOM are normal. Pupils are equal, round, and reactive to light. Right eye exhibits no discharge. Left eye exhibits no discharge.  Neck: Normal range of motion. Neck supple. No JVD present. No thyromegaly present.  Cardiovascular: Normal rate, regular rhythm, normal heart sounds and intact distal pulses.  Exam reveals no gallop and no friction rub.   No murmur heard. Pulmonary/Chest: Effort normal and breath sounds normal. She has no wheezes. She has no rales.  Abdominal: Soft. Bowel sounds are normal. She exhibits no mass. There is no tenderness. There is no guarding.  Musculoskeletal: Normal range of motion. She exhibits no edema.       Right knee: She exhibits normal range of motion, no swelling, no effusion, no LCL laxity and no MCL laxity. Tenderness found. Lateral joint line tenderness noted. No MCL, no LCL and no patellar tendon tenderness noted.       Legs: Lymphadenopathy:    She has no cervical adenopathy.  Neurological: She is alert. She has normal reflexes.  Skin: Skin is warm and dry. She is not diaphoretic.  Psychiatric: Mood and affect normal.  Nursing note and vitals reviewed.     Assessment & Plan  Problem List Items Addressed This Visit      Endocrine   Type 2 diabetes mellitus without complication, without long-term current use of insulin ([place])   Relevant Orders   Hemoglobin A1c    Other Visit Diagnoses    Meniscal injury, right,  initial encounter    -  Primary    Relevant Medications    traMADol (ULTRAM) 50 MG tablet    Other Relevant Orders    DG Knee Complete 4 Views Right         Dr. Otilio Miu [facility]  01/07/2016

## 2016-01-22 DIAGNOSIS — M2240 Chondromalacia patellae, unspecified knee: Secondary | ICD-10-CM | POA: Insufficient documentation

## 2016-01-22 DIAGNOSIS — M1711 Unilateral primary osteoarthritis, right knee: Secondary | ICD-10-CM | POA: Diagnosis not present

## 2016-02-03 ENCOUNTER — Other Ambulatory Visit: Payer: Self-pay

## 2016-02-03 DIAGNOSIS — E119 Type 2 diabetes mellitus without complications: Secondary | ICD-10-CM

## 2016-02-03 MED ORDER — GLIPIZIDE ER 10 MG PO TB24: 10.0000 mg | ORAL_TABLET | Freq: Every day | ORAL | Status: DC

## 2016-02-19 ENCOUNTER — Other Ambulatory Visit: Payer: Medicare Other

## 2016-02-19 ENCOUNTER — Other Ambulatory Visit
Admission: RE | Admit: 2016-02-19 | Discharge: 2016-02-19 | Disposition: A | Discharge status: Home / Self Care | Payer: Medicare Other | Source: Ambulatory Visit | Attending: Family Medicine | Admitting: Family Medicine

## 2016-02-19 ENCOUNTER — Other Ambulatory Visit: Payer: Self-pay

## 2016-02-19 DIAGNOSIS — E119 Type 2 diabetes mellitus without complications: Secondary | ICD-10-CM

## 2016-02-19 LAB — HEMOGLOBIN A1C: Hgb A1c MFr Bld: 6.8 % — ABNORMAL HIGH (ref 4.0–6.0)

## 2016-04-15 ENCOUNTER — Other Ambulatory Visit: Payer: Self-pay | Admitting: Obstetrics and Gynecology

## 2016-04-15 DIAGNOSIS — Z1231 Encounter for screening mammogram for malignant neoplasm of breast: Secondary | ICD-10-CM

## 2016-04-21 DIAGNOSIS — H04123 Dry eye syndrome of bilateral lacrimal glands: Secondary | ICD-10-CM | POA: Diagnosis not present

## 2016-04-21 DIAGNOSIS — H40003 Preglaucoma, unspecified, bilateral: Secondary | ICD-10-CM | POA: Diagnosis not present

## 2016-04-28 DIAGNOSIS — R6889 Other general symptoms and signs: Secondary | ICD-10-CM | POA: Diagnosis not present

## 2016-05-04 ENCOUNTER — Other Ambulatory Visit: Payer: Self-pay | Admitting: Family Medicine

## 2016-05-04 DIAGNOSIS — E119 Type 2 diabetes mellitus without complications: Secondary | ICD-10-CM

## 2016-05-06 ENCOUNTER — Other Ambulatory Visit: Payer: Self-pay

## 2016-05-11 ENCOUNTER — Other Ambulatory Visit: Payer: Self-pay | Admitting: Family Medicine

## 2016-05-11 ENCOUNTER — Ambulatory Visit
Admission: RE | Admit: 2016-05-11 | Discharge: 2016-05-11 | Disposition: A | Discharge status: Home / Self Care | Payer: Medicare Other | Source: Ambulatory Visit | Attending: Family Medicine | Admitting: Family Medicine

## 2016-05-11 DIAGNOSIS — Z1231 Encounter for screening mammogram for malignant neoplasm of breast: Secondary | ICD-10-CM | POA: Insufficient documentation

## 2016-05-11 HISTORY — DX: Malignant (primary) neoplasm, unspecified: C80.1

## 2016-05-26 ENCOUNTER — Ambulatory Visit (INDEPENDENT_AMBULATORY_CARE_PROVIDER_SITE_OTHER): Payer: Medicare Other | Admitting: Family Medicine

## 2016-05-26 VITALS — BP 110/70 | HR 60 | Ht 65.0 in | Wt 174.0 lb

## 2016-05-26 DIAGNOSIS — I10 Essential (primary) hypertension: Secondary | ICD-10-CM

## 2016-05-26 DIAGNOSIS — E784 Other hyperlipidemia: Secondary | ICD-10-CM | POA: Diagnosis not present

## 2016-05-26 DIAGNOSIS — E119 Type 2 diabetes mellitus without complications: Secondary | ICD-10-CM

## 2016-05-26 DIAGNOSIS — R69 Illness, unspecified: Secondary | ICD-10-CM | POA: Diagnosis not present

## 2016-05-26 DIAGNOSIS — E7849 Other hyperlipidemia: Secondary | ICD-10-CM

## 2016-05-26 MED ORDER — GLUCOSE BLOOD VI STRP: ORAL_STRIP | 1 refills | Status: AC

## 2016-05-26 MED ORDER — METFORMIN HCL 1000 MG PO TABS: 1000.0000 mg | ORAL_TABLET | Freq: Two times a day (BID) | ORAL | 1 refills | Status: DC

## 2016-05-26 MED ORDER — GLIPIZIDE ER 10 MG PO TB24: ORAL_TABLET | ORAL | 1 refills | Status: DC

## 2016-05-26 MED ORDER — FENOFIBRATE 160 MG PO TABS: 160.0000 mg | ORAL_TABLET | Freq: Every day | ORAL | 1 refills | Status: DC

## 2016-05-26 MED ORDER — LOVASTATIN 40 MG PO TABS: 40.0000 mg | ORAL_TABLET | Freq: Every day | ORAL | 1 refills | Status: DC

## 2016-05-26 MED ORDER — AMLODIPINE BESYLATE 10 MG PO TABS: 10.0000 mg | ORAL_TABLET | Freq: Every day | ORAL | 1 refills | Status: DC

## 2016-05-26 MED ORDER — LOSARTAN POTASSIUM 100 MG PO TABS: 100.0000 mg | ORAL_TABLET | Freq: Every day | ORAL | 1 refills | Status: DC

## 2016-05-26 MED ORDER — CARVEDILOL 25 MG PO TABS: 25.0000 mg | ORAL_TABLET | Freq: Two times a day (BID) | ORAL | 1 refills | Status: DC

## 2016-05-26 NOTE — Progress Notes (Signed)
Name: BERNISHA PIETSCH   MRN: [id number]    DOB: 03/15/1946   Date:05/26/2016       Progress Note  Subjective  Chief Complaint  Chief Complaint  Patient presents with  . Diabetes  . Hypertension  . Hyperlipidemia    Diabetes  She presents for her follow-up diabetic visit. She has type 2 diabetes mellitus. Her disease course has been stable. There are no hypoglycemic associated symptoms. Pertinent negatives for hypoglycemia include no confusion, dizziness, headaches, hunger, mood changes, nervousness/anxiousness, pallor, seizures, sleepiness, speech difficulty, sweats or tremors. Associated symptoms include weight loss. Pertinent negatives for diabetes include no blurred vision, no chest pain, no fatigue, no foot paresthesias, no foot ulcerations, no polydipsia, no polyuria, no visual change and no weakness. There are no hypoglycemic complications. Pertinent negatives for diabetic complications include no CVA, PVD or retinopathy. Risk factors for coronary artery disease include diabetes mellitus and dyslipidemia. Current diabetic treatment includes oral agent (dual therapy). She is compliant with treatment all of the time. Her weight is stable. She is following a generally healthy diet. She participates in exercise three times a week. Her breakfast blood glucose is taken between 8-9 am. Her breakfast blood glucose range is generally 70-90 mg/dl. She does not see a podiatrist.Eye exam is not current.  Hypertension  This is a chronic problem. The current episode started more than 1 year ago. The problem has been rapidly improving since onset. The problem is controlled. Pertinent negatives include no anxiety, blurred vision, chest pain, headaches, malaise/fatigue, neck pain, orthopnea, palpitations, peripheral edema, PND, shortness of breath or sweats. There are no associated agents to hypertension. There are no known risk factors for coronary artery disease. Past treatments include beta blockers and  calcium channel blockers. The current treatment provides moderate improvement. There are no compliance problems.  There is no history of angina, kidney disease, CAD/MI, CVA, heart failure, left ventricular hypertrophy, PVD or retinopathy. There is no history of chronic renal disease.  Hyperlipidemia  This is a chronic problem. The current episode started more than 1 year ago. The problem is controlled. Recent lipid tests were reviewed and are normal. Exacerbating diseases include diabetes. She has no history of chronic renal disease, hypothyroidism or obesity. Pertinent negatives include no chest pain, focal weakness, myalgias or shortness of breath. Current antihyperlipidemic treatment includes statins. The current treatment provides mild improvement of lipids. There are no compliance problems.     No problem-specific Assessment & Plan notes found for this encounter.   Past Medical History:  Diagnosis Date  . Anxiety   . 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 Diabetes mellitus without complication ([place])   . Hyperlipidemia   . Hypertension     Past Surgical History:  Procedure Laterality Date  . BREAST BIOPSY Left 2011   neg almost in cleavage  . COLONOSCOPY  10/17/2014   cleared for 3 years/ polyps- Dr Allen Norris  . CYSTOCELE REPAIR    . OOPHORECTOMY Bilateral 1971  . VAGINAL HYSTERECTOMY      Family History  Problem Relation Age of Onset  . Cancer Mother   . Diabetes Mother   . Diabetes Father   . Diabetes Maternal Grandfather   . Stroke Maternal Grandfather   . Diabetes Paternal Grandfather   . Breast cancer Neg Hx     Social History   Social History  . Marital status: Widowed    Spouse name: N/A  . Number of children: N/A  . Years of education: N/A  Occupational History  . Not on file.   Social History Main Topics  . Smoking status: Former Research scientist (life sciences)  . Smokeless tobacco: Not on file  . Alcohol use 0.0 oz/week  . Drug use: No  . Sexual activity: No   Other Topics Concern   . Not on file   Social History Narrative  . No narrative on file    Allergies  Allergen Reactions  . Metronidazole Anaphylaxis  . Penicillins Anaphylaxis and Hives  . Tetracyclines & Related Hives     Review of Systems  Constitutional: Positive for weight loss. Negative for chills, fatigue, fever and malaise/fatigue.  HENT: Negative for ear discharge, ear pain and sore throat.   Eyes: Negative for blurred vision.  Respiratory: Negative for cough, sputum production, shortness of breath and wheezing.   Cardiovascular: Negative for chest pain, palpitations, orthopnea, leg swelling and PND.  Gastrointestinal: Negative for abdominal pain, blood in stool, constipation, diarrhea, heartburn, melena and nausea.  Genitourinary: Negative for dysuria, frequency, hematuria and urgency.  Musculoskeletal: Negative for back pain, joint pain, myalgias and neck pain.  Skin: Negative for pallor and rash.  Neurological: Negative for dizziness, tingling, tremors, sensory change, focal weakness, seizures, speech difficulty, weakness and headaches.  Endo/Heme/Allergies: Negative for environmental allergies and polydipsia. Does not bruise/bleed easily.  Psychiatric/Behavioral: Negative for confusion, depression and suicidal ideas. The patient is not nervous/anxious and does not have insomnia.      Objective  Vitals:   05/26/16 0854  BP: 110/70  Pulse: 60  Weight: 174 lb (78.9 kg)  Height: 5\' 5"  (1.651 m)    Physical Exam  Constitutional: She is well-developed, well-nourished, and in no distress. No distress.  HENT:  Head: Normocephalic and atraumatic.  Right Ear: External ear normal.  Left Ear: External ear normal.  Nose: Nose normal.  Mouth/Throat: Oropharynx is clear and moist.  Eyes: Conjunctivae and EOM are normal. Pupils are equal, round, and reactive to light. Right eye exhibits no discharge. Left eye exhibits no discharge.  Neck: Normal range of motion. Neck supple. No JVD present.  No thyromegaly present.  Cardiovascular: Normal rate, regular rhythm, normal heart sounds and intact distal pulses.  Exam reveals no gallop and no friction rub.   No murmur heard. Pulmonary/Chest: Effort normal and breath sounds normal. She has no wheezes. She has no rales.  Abdominal: Soft. Bowel sounds are normal. She exhibits no mass. There is no tenderness. There is no guarding.  Musculoskeletal: Normal range of motion. She exhibits no edema.  Lymphadenopathy:    She has no cervical adenopathy.  Neurological: She is alert. She has normal reflexes.  Skin: Skin is warm and dry. She is not diaphoretic.  Psychiatric: Mood and affect normal.  Nursing note and vitals reviewed.     Assessment & Plan  Problem List Items Addressed This Visit      Endocrine   Type 2 diabetes mellitus without complication, without long-term current use of insulin (HCC)   Relevant Medications   glucose blood test strip   metFORMIN (GLUCOPHAGE) 1000 MG tablet   lovastatin (MEVACOR) 40 MG tablet   glipiZIDE (GLUCOTROL XL) 10 MG 24 hr tablet   losartan (COZAAR) 100 MG tablet   Other Relevant Orders   HgB A1c   Urine Microalbumin w/creat. ratio    Other Visit Diagnoses    Taking medication for chronic disease    -  Primary   Relevant Orders   Hepatic function panel   Other hyperlipidemia  Relevant Medications   lovastatin (MEVACOR) 40 MG tablet   losartan (COZAAR) 100 MG tablet   fenofibrate 160 MG tablet   carvedilol (COREG) 25 MG tablet   amLODipine (NORVASC) 10 MG tablet   Other Relevant Orders   Lipid Profile   Essential hypertension       Relevant Medications   lovastatin (MEVACOR) 40 MG tablet   losartan (COZAAR) 100 MG tablet   fenofibrate 160 MG tablet   carvedilol (COREG) 25 MG tablet   amLODipine (NORVASC) 10 MG tablet   Other Relevant Orders   Renal Function Panel        Dr. Deanna Jones [facility]  05/26/16

## 2016-05-27 LAB — MICROALBUMIN / CREATININE URINE RATIO
CREATININE, UR: 132.3 mg/dL
MICROALB/CREAT RATIO: 6.8 mg/g{creat} (ref 0.0–30.0)
Microalbumin, Urine: 9 ug/mL

## 2016-05-27 LAB — LIPID PANEL
CHOLESTEROL TOTAL: 149 mg/dL (ref 100–199)
Chol/HDL Ratio: 2.9 ratio units (ref 0.0–4.4)
HDL: 51 mg/dL (ref >39)
LDL Calculated: 78 mg/dL (ref 0–99)
Triglycerides: 100 mg/dL (ref 0–149)
VLDL Cholesterol Cal: 20 mg/dL (ref 5–40)

## 2016-05-27 LAB — RENAL FUNCTION PANEL
ALBUMIN: 4.7 g/dL (ref 3.5–4.8)
BUN/Creatinine Ratio: 23 (ref 12–28)
BUN: 19 mg/dL (ref 8–27)
CO2: 25 mmol/L (ref 18–29)
CREATININE: 0.81 mg/dL (ref 0.57–1.00)
Calcium: 10.4 mg/dL — ABNORMAL HIGH (ref 8.7–10.3)
Chloride: 102 mmol/L (ref 96–106)
GFR, EST AFRICAN AMERICAN: 85 mL/min/{1.73_m2} (ref >59)
GFR, EST NON AFRICAN AMERICAN: 74 mL/min/{1.73_m2} (ref >59)
Glucose: 130 mg/dL — ABNORMAL HIGH (ref 65–99)
PHOSPHORUS: 4.4 mg/dL (ref 2.5–4.5)
Potassium: 5.3 mmol/L — ABNORMAL HIGH (ref 3.5–5.2)
Sodium: 144 mmol/L (ref 134–144)

## 2016-05-27 LAB — HEPATIC FUNCTION PANEL
ALK PHOS: 37 IU/L — AB (ref 39–117)
ALT: 15 IU/L (ref 0–32)
AST: 26 IU/L (ref 0–40)
BILIRUBIN TOTAL: 0.4 mg/dL (ref 0.0–1.2)
BILIRUBIN, DIRECT: 0.16 mg/dL (ref 0.00–0.40)
Total Protein: 7.7 g/dL (ref 6.0–8.5)

## 2016-05-27 LAB — HEMOGLOBIN A1C
ESTIMATED AVERAGE GLUCOSE: 126 mg/dL
HEMOGLOBIN A1C: 6 % — AB (ref 4.8–5.6)

## 2016-05-31 ENCOUNTER — Other Ambulatory Visit: Payer: Self-pay

## 2016-06-07 ENCOUNTER — Other Ambulatory Visit: Payer: Self-pay

## 2016-07-19 ENCOUNTER — Ambulatory Visit (INDEPENDENT_AMBULATORY_CARE_PROVIDER_SITE_OTHER): Payer: Medicare Other | Admitting: Family Medicine

## 2016-07-19 ENCOUNTER — Other Ambulatory Visit: Payer: Self-pay

## 2016-07-19 ENCOUNTER — Encounter: Payer: Self-pay | Admitting: Family Medicine

## 2016-07-19 VITALS — BP 120/78 | HR 72 | Temp 98.2°F | Ht 65.0 in | Wt 172.0 lb

## 2016-07-19 DIAGNOSIS — J40 Bronchitis, not specified as acute or chronic: Secondary | ICD-10-CM

## 2016-07-19 DIAGNOSIS — J4520 Mild intermittent asthma, uncomplicated: Secondary | ICD-10-CM

## 2016-07-19 MED ORDER — AZITHROMYCIN 250 MG PO TABS: ORAL_TABLET | ORAL | 0 refills | Status: DC

## 2016-07-19 MED ORDER — ALBUTEROL SULFATE HFA 108 (90 BASE) MCG/ACT IN AERS: 2.0000 | INHALATION_SPRAY | Freq: Four times a day (QID) | RESPIRATORY_TRACT | 0 refills | Status: DC | PRN

## 2016-07-19 MED ORDER — GUAIFENESIN-CODEINE 100-10 MG/5ML PO SYRP: 5.0000 mL | ORAL_SOLUTION | Freq: Three times a day (TID) | ORAL | 0 refills | Status: DC | PRN

## 2016-07-19 NOTE — Patient Instructions (Signed)
Metered Dose Inhaler With Spacer Inhaled medicines are the basis of treatment of asthma and other breathing problems. Inhaled medicine can only be effective if used properly. Good technique assures that the medicine reaches the lungs. Your health care provider has asked you to use a spacer with your inhaler to help you take the medicine more effectively. A spacer is a plastic tube with a mouthpiece on one end and an opening that connects to the inhaler on the other end. Metered dose inhalers (MDIs) are used to deliver a variety of inhaled medicines. These include quick relief or rescue medicines (such as bronchodilators) and controller medicines (such as corticosteroids). The medicine is delivered by pushing down on a metal canister to release a set amount of spray. If you are using different kinds of inhalers, use your quick relief medicine to open the airways 10-15 minutes before using a steroid if instructed to do so by your health care provider. If you are unsure which inhalers to use and the order of using them, ask your health care provider, nurse, or respiratory therapist. HOW TO USE THE INHALER WITH A SPACER 1. Remove cap from inhaler. 2. If you are using the inhaler for the first time, you will need to prime it. Shake the inhaler for 5 seconds and release four puffs into the air, away from your face. Ask your health care provider or pharmacist if you have questions about priming your inhaler. 3. Shake inhaler for 5 seconds before each breath in (inhalation). 4. Place the open end of the spacer onto the mouthpiece of the inhaler. 5. Position the inhaler so that the top of the canister faces up and the spacer mouthpiece faces you. 6. Put your index finger on the top of the medicine canister. Your thumb supports the bottom of the inhaler and the spacer. 7. Breathe out (exhale) normally and as completely as possible. 8. Immediately after exhaling, place the spacer between your teeth and into your  mouth. Close your mouth tightly around the spacer. 9. Press the canister down with the index finger to release the medicine. 10. At the same time as the canister is pressed, inhale deeply and slowly until the lungs are completely filled. This should take 4-6 seconds. Keep your tongue down and out of the way. 11. Hold the medicine in your lungs for 5-10 seconds (10 seconds is best). This helps the medicine get into the small airways of your lungs. Exhale. 12. Repeat inhaling deeply through the spacer mouthpiece. Again hold that breath for up to 10 seconds (10 seconds is best). Exhale slowly. If it is difficult to take this second deep breath through the spacer, breathe normally several times through the spacer. Remove the spacer from your mouth. 13. Wait at least 15-30 seconds between puffs. Continue with the above steps until you have taken the number of puffs your health care provider has ordered. Do not use the inhaler more than your health care provider directs you to. 14. Remove spacer from the inhaler and place cap on inhaler. 15. Follow the directions from your health care provider or the inhaler insert for cleaning the inhaler and spacer. If you are using a steroid inhaler, rinse your mouth with water after your last puff, gargle, and spit out the water. Do not swallow the water. AVOID:   Inhaling before or after starting the spray of medicine. It takes practice to coordinate your breathing with triggering the spray.  Inhaling through the nose (rather than the mouth) when  triggering the spray. HOW TO DETERMINE IF YOUR INHALER IS FULL OR NEARLY EMPTY You cannot know when an inhaler is empty by shaking it. A few inhalers are now being made with dose counters. Ask your health care provider for a prescription that has a dose counter if you feel you need that extra help. If your inhaler does not have a counter, ask your health care provider to help you determine the date you need to refill your  inhaler. Write the refill date on a calendar or your inhaler canister. Refill your inhaler 7-10 days before it runs out. Be sure to keep an adequate supply of medicine. This includes making sure it is not expired, and you have a spare inhaler.  SEEK MEDICAL CARE IF:   Symptoms are only partially relieved with your inhaler.  You are having trouble using your inhaler.  You experience some increase in phlegm. SEEK IMMEDIATE MEDICAL CARE IF:   You feel little or no relief with your inhalers. You are still wheezing and are feeling shortness of breath or tightness in your chest or both.  You have dizziness, headaches, or fast heart rate.  You have chills, fever, or night sweats.  There is a noticeable increase in phlegm production, or there is blood in the phlegm. This information is not intended to replace advice given to you by your health care provider. Make sure you discuss any questions you have with your health care provider. Document Released: 07/26/2005 Document Revised: 12/10/2014 Document Reviewed: 01/11/2013 Elsevier Interactive Patient Education  2017 [employer].

## 2016-07-19 NOTE — Progress Notes (Signed)
Name: Sara Atkinson   MRN: [id number]    DOB: Dec 17, 1945   Date:07/19/2016       Progress Note  Subjective  Chief Complaint  Chief Complaint  Patient presents with  . Sinusitis    cough and cong with light yellow production- gets worse at night and late afternoon    Sinusitis  This is a new problem. The current episode started in the past 7 days. The problem has been waxing and waning since onset. There has been no fever. The pain is mild. Associated symptoms include congestion, coughing, headaches, shortness of breath, sinus pressure and a sore throat. Pertinent negatives include no chills, diaphoresis, ear pain, hoarse voice, neck pain or swollen glands. Past treatments include nothing. The treatment provided mild relief.    No problem-specific Assessment & Plan notes found for this encounter.   Past Medical History:  Diagnosis Date  . Anxiety   . 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 Diabetes mellitus without complication ([place])   . Hyperlipidemia   . Hypertension     Past Surgical History:  Procedure Laterality Date  . BREAST BIOPSY Left 2011   neg almost in cleavage  . COLONOSCOPY  10/17/2014   cleared for 3 years/ polyps- Dr Allen Norris  . CYSTOCELE REPAIR    . OOPHORECTOMY Bilateral 1971  . VAGINAL HYSTERECTOMY      Family History  Problem Relation Age of Onset  . Cancer Mother   . Diabetes Mother   . Diabetes Father   . Diabetes Maternal Grandfather   . Stroke Maternal Grandfather   . Diabetes Paternal Grandfather   . Breast cancer Neg Hx     Social History   Social History  . Marital status: Widowed    Spouse name: N/A  . Number of children: N/A  . Years of education: N/A   Occupational History  . Not on file.   Social History Main Topics  . Smoking status: Former Research scientist (life sciences)  . Smokeless tobacco: Not on file  . Alcohol use 0.0 oz/week  . Drug use: No  . Sexual activity: No   Other Topics Concern  . Not on file   Social History Narrative  . No narrative  on file    Allergies  Allergen Reactions  . Metronidazole Anaphylaxis  . Penicillins Anaphylaxis and Hives  . Tetracyclines & Related Hives     Review of Systems  Constitutional: Negative for chills, diaphoresis, fever, malaise/fatigue and weight loss.  HENT: Positive for congestion, sinus pressure and sore throat. Negative for ear discharge, ear pain and hoarse voice.   Eyes: Negative for blurred vision.  Respiratory: Positive for cough and shortness of breath. Negative for sputum production and wheezing.   Cardiovascular: Negative for chest pain, palpitations and leg swelling.  Gastrointestinal: Negative for abdominal pain, blood in stool, constipation, diarrhea, heartburn, melena and nausea.  Genitourinary: Negative for dysuria, frequency, hematuria and urgency.  Musculoskeletal: Negative for back pain, joint pain, myalgias and neck pain.  Skin: Negative for rash.  Neurological: Positive for headaches. Negative for dizziness, tingling, sensory change and focal weakness.  Endo/Heme/Allergies: Negative for environmental allergies and polydipsia. Does not bruise/bleed easily.  Psychiatric/Behavioral: Negative for depression and suicidal ideas. The patient is not nervous/anxious and does not have insomnia.      Objective  Vitals:   07/19/16 1502  BP: 120/78  Pulse: 72  Temp: 98.2 F (36.8 C)  TempSrc: Oral  Weight: 172 lb (78 kg)  Height: 5\' 5"  (1.651 m)  Physical Exam  Constitutional: She is well-developed, well-nourished, and in no distress. No distress.  HENT:  Head: Normocephalic and atraumatic.  Right Ear: External ear normal.  Left Ear: External ear normal.  Nose: Nose normal.  Mouth/Throat: Oropharynx is clear and moist.  Eyes: Conjunctivae and EOM are normal. Pupils are equal, round, and reactive to light. Right eye exhibits no discharge. Left eye exhibits no discharge.  Neck: Normal range of motion. Neck supple. No JVD present. No thyromegaly present.   Cardiovascular: Normal rate, regular rhythm, normal heart sounds and intact distal pulses.  Exam reveals no gallop and no friction rub.   No murmur heard. Pulmonary/Chest: Effort normal and breath sounds normal. No respiratory distress. She has no wheezes. She has no rales. She exhibits no tenderness.  Abdominal: Soft. Bowel sounds are normal. She exhibits no mass. There is no tenderness. There is no guarding.  Musculoskeletal: Normal range of motion. She exhibits no edema.  Lymphadenopathy:    She has no cervical adenopathy.  Neurological: She is alert. She has normal reflexes.  Skin: Skin is warm and dry. She is not diaphoretic.  Psychiatric: Mood and affect normal.  Nursing note and vitals reviewed.     Assessment & Plan  Problem List Items Addressed This Visit    None    Visit Diagnoses    Bronchitis    -  Primary   Relevant Medications   azithromycin (ZITHROMAX) 250 MG tablet   guaiFENesin-codeine (ROBITUSSIN AC) 100-10 MG/5ML syrup   albuterol (PROVENTIL HFA;VENTOLIN HFA) 108 (90 Base) MCG/ACT inhaler   Mild intermittent reactive airway disease without complication       Relevant Medications   guaiFENesin-codeine (ROBITUSSIN AC) 100-10 MG/5ML syrup   albuterol (PROVENTIL HFA;VENTOLIN HFA) 108 (90 Base) MCG/ACT inhaler        Dr. Deanna [facility]  07/19/16

## 2016-08-31 ENCOUNTER — Encounter: Payer: Self-pay | Admitting: Family Medicine

## 2016-08-31 ENCOUNTER — Ambulatory Visit (INDEPENDENT_AMBULATORY_CARE_PROVIDER_SITE_OTHER): Payer: Medicare Other | Admitting: Family Medicine

## 2016-08-31 VITALS — BP 120/70 | HR 78 | Ht 65.0 in | Wt 173.0 lb

## 2016-08-31 DIAGNOSIS — I10 Essential (primary) hypertension: Secondary | ICD-10-CM

## 2016-08-31 DIAGNOSIS — E119 Type 2 diabetes mellitus without complications: Secondary | ICD-10-CM

## 2016-08-31 DIAGNOSIS — J4521 Mild intermittent asthma with (acute) exacerbation: Secondary | ICD-10-CM | POA: Diagnosis not present

## 2016-08-31 MED ORDER — LOSARTAN POTASSIUM 50 MG PO TABS: 50.0000 mg | ORAL_TABLET | Freq: Every day | ORAL | 1 refills | Status: DC

## 2016-08-31 NOTE — Progress Notes (Signed)
Name: Sara Atkinson   MRN: [id number]    DOB: 1946-03-13   Date:08/31/2016       Progress Note  Subjective  Chief Complaint  Chief Complaint  Patient presents with  . Follow-up    dropped glipizide down to once a day- hgb A1C was 6.0 in Oct    Diabetes  She presents for her follow-up diabetic visit. She has type 2 diabetes mellitus. Her disease course has been stable. There are no hypoglycemic associated symptoms. Pertinent negatives for hypoglycemia include no dizziness, headaches or nervousness/anxiousness. Associated symptoms include foot paresthesias and weight loss. Pertinent negatives for diabetes include no blurred vision, no chest pain, no foot ulcerations, no polydipsia, no polyuria and no visual change. (Loss by design) There are no hypoglycemic complications. Symptoms are stable. There are no diabetic complications. Pertinent negatives for diabetic complications include no CVA or PVD. Risk factors for coronary artery disease include dyslipidemia. Current diabetic treatment includes oral agent (dual therapy). She is compliant with treatment all of the time. Her weight is stable. She is following a generally healthy diet. She participates in exercise three times a week. Her home blood glucose trend is fluctuating minimally. Her breakfast blood glucose range is generally 110-130 mg/dl. An ACE inhibitor/angiotensin II receptor blocker is being taken. She does not see a podiatrist.Eye exam is current.  Hypertension  This is a chronic problem. The current episode started more than 1 year ago. The problem is unchanged. The problem is controlled. Pertinent negatives include no blurred vision, chest pain, headaches, malaise/fatigue, neck pain, palpitations or shortness of breath. Past treatments include calcium channel blockers, beta blockers and angiotensin blockers. The current treatment provides moderate improvement. There is no history of angina, kidney disease, CAD/MI, CVA, heart failure, left  ventricular hypertrophy, PVD or renovascular disease. There is no history of chronic renal disease or a hypertension causing med.  Cough  This is a recurrent problem. The current episode started more than 1 month ago. The problem has been waxing and waning. The cough is non-productive. Associated symptoms include weight loss. Pertinent negatives include no chest pain, chills, ear pain, fever, headaches, heartburn, myalgias, rash, sore throat, shortness of breath or wheezing. She has tried a beta-agonist inhaler for the symptoms. The treatment provided mild relief. There is no history of environmental allergies.    No problem-specific Assessment & Plan notes found for this encounter.   Past Medical History:  Diagnosis Date  . Anxiety   . 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 Diabetes mellitus without complication ([place])   . Hyperlipidemia   . Hypertension     Past Surgical History:  Procedure Laterality Date  . BREAST BIOPSY Left 2011   neg almost in cleavage  . COLONOSCOPY  10/17/2014   cleared for 3 years/ polyps- Dr Allen Norris  . CYSTOCELE REPAIR    . OOPHORECTOMY Bilateral 1971  . VAGINAL HYSTERECTOMY      Family History  Problem Relation Age of Onset  . Cancer Mother   . Diabetes Mother   . Diabetes Father   . Diabetes Maternal Grandfather   . Stroke Maternal Grandfather   . Diabetes Paternal Grandfather   . Breast cancer Neg Hx     Social History   Social History  . Marital status: Widowed    Spouse name: N/A  . Number of children: N/A  . Years of education: N/A   Occupational History  . Not on file.   Social History Main Topics  . Smoking  status: Former Research scientist (life sciences)  . Smokeless tobacco: Never Used  . Alcohol use 0.0 oz/week  . Drug use: No  . Sexual activity: No   Other Topics Concern  . Not on file   Social History Narrative  . No narrative on file    Allergies  Allergen Reactions  . Metronidazole Anaphylaxis  . Penicillins Anaphylaxis and Hives  .  Tetracyclines & Related Hives     Review of Systems  Constitutional: Positive for weight loss. Negative for chills, fever and malaise/fatigue.  HENT: Negative for ear discharge, ear pain and sore throat.   Eyes: Negative for blurred vision.  Respiratory: Negative for cough, sputum production, shortness of breath and wheezing.   Cardiovascular: Negative for chest pain, palpitations and leg swelling.  Gastrointestinal: Negative for abdominal pain, blood in stool, constipation, diarrhea, heartburn, melena and nausea.  Genitourinary: Negative for dysuria, frequency, hematuria and urgency.  Musculoskeletal: Negative for back pain, joint pain, myalgias and neck pain.  Skin: Negative for rash.  Neurological: Negative for dizziness, tingling, sensory change, focal weakness and headaches.  Endo/Heme/Allergies: Negative for environmental allergies and polydipsia. Does not bruise/bleed easily.  Psychiatric/Behavioral: Negative for depression and suicidal ideas. The patient is not nervous/anxious and does not have insomnia.      Objective  Vitals:   08/31/16 0909  BP: 120/70  Pulse: 78  Weight: 173 lb (78.5 kg)  Height: 5\' 5"  (1.651 m)    Physical Exam  Constitutional: She is well-developed, well-nourished, and in no distress. No distress.  HENT:  Head: Normocephalic and atraumatic.  Right Ear: External ear normal.  Left Ear: External ear normal.  Nose: Nose normal.  Mouth/Throat: Oropharynx is clear and moist.  Eyes: Conjunctivae and EOM are normal. Pupils are equal, round, and reactive to light. Right eye exhibits no discharge. Left eye exhibits no discharge.  Neck: Normal range of motion. Neck supple. No JVD present. No thyromegaly present.  Cardiovascular: Normal rate, regular rhythm, normal heart sounds and intact distal pulses.  Exam reveals no gallop and no friction rub.   No murmur heard. Pulmonary/Chest: Effort normal and breath sounds normal. She has no wheezes. She has no  rales.  Abdominal: Soft. Bowel sounds are normal. She exhibits no mass. There is no tenderness. There is no guarding.  Musculoskeletal: Normal range of motion. She exhibits no edema.  Lymphadenopathy:    She has no cervical adenopathy.  Neurological: She is alert. She has normal reflexes.  Skin: Skin is warm and dry. She is not diaphoretic.  Psychiatric: Mood and affect normal.  Nursing note and vitals reviewed.     Assessment & Plan  Problem List Items Addressed This Visit      Endocrine   Type 2 diabetes mellitus without complication, without long-term current use of insulin (HCC) - Primary   Relevant Medications   losartan (COZAAR) 50 MG tablet   Other Relevant Orders   Hemoglobin A1c    Other Visit Diagnoses    Essential hypertension       Relevant Medications   losartan (COZAAR) 50 MG tablet   Mild intermittent reactive airway disease with acute exacerbation       anoro sample     N/A  I spent 30 minutes with this patient, More than 50% of that time was spent in face to face education, counseling and care coordination. Dr. [facility]  08/31/16

## 2016-09-01 LAB — HEMOGLOBIN A1C
Est. average glucose Bld gHb Est-mCnc: 123 mg/dL
HEMOGLOBIN A1C: 5.9 % — AB (ref 4.8–5.6)

## 2016-09-16 ENCOUNTER — Ambulatory Visit (INDEPENDENT_AMBULATORY_CARE_PROVIDER_SITE_OTHER): Payer: Medicare Other | Admitting: Family Medicine

## 2016-09-16 ENCOUNTER — Encounter: Payer: Self-pay | Admitting: Family Medicine

## 2016-09-16 VITALS — BP 134/82 | HR 60 | Ht 65.0 in | Wt 171.0 lb

## 2016-09-16 DIAGNOSIS — E1159 Type 2 diabetes mellitus with other circulatory complications: Secondary | ICD-10-CM | POA: Diagnosis not present

## 2016-09-16 DIAGNOSIS — I10 Essential (primary) hypertension: Secondary | ICD-10-CM | POA: Diagnosis not present

## 2016-09-16 DIAGNOSIS — E119 Type 2 diabetes mellitus without complications: Secondary | ICD-10-CM | POA: Diagnosis not present

## 2016-09-16 MED ORDER — CARVEDILOL 25 MG PO TABS: 25.0000 mg | ORAL_TABLET | Freq: Two times a day (BID) | ORAL | 1 refills | Status: DC

## 2016-09-16 MED ORDER — GLIPIZIDE ER 5 MG PO TB24
5.0000 mg | ORAL_TABLET | Freq: Every day | ORAL | 2 refills | Status: DC
Start: 2016-09-16 — End: 2017-02-10

## 2016-09-16 MED ORDER — LOSARTAN POTASSIUM 50 MG PO TABS: 50.0000 mg | ORAL_TABLET | Freq: Every day | ORAL | 1 refills | Status: DC

## 2016-09-16 MED ORDER — AMLODIPINE BESYLATE 10 MG PO TABS: 10.0000 mg | ORAL_TABLET | Freq: Every day | ORAL | 1 refills | Status: DC

## 2016-09-16 NOTE — Progress Notes (Signed)
Name: Sara Atkinson   MRN: [id number]    DOB: 10-23-1945   Date:09/16/2016       Progress Note  Subjective  Chief Complaint  Chief Complaint  Patient presents with  . Diabetes    average is 150 since stopping Glipizide 10mg   . Hypertension    Losartan cut in half to 50mg - 140s/70s    Diabetes  She presents for her follow-up diabetic visit. She has type 2 diabetes mellitus. Her disease course has been stable. Pertinent negatives for hypoglycemia include no confusion, dizziness, headaches, hunger, mood changes, nervousness/anxiousness, pallor, seizures, sleepiness, speech difficulty, sweats or tremors. Pertinent negatives for diabetes include no blurred vision, no chest pain, no fatigue, no foot paresthesias, no foot ulcerations, no polydipsia, no polyphagia, no polyuria, no visual change, no weakness and no weight loss. There are no hypoglycemic complications. Symptoms are stable. Pertinent negatives for diabetic complications include no autonomic neuropathy, CVA, heart disease, impotence, nephropathy, peripheral neuropathy or PVD. Risk factors for coronary artery disease include hypertension and dyslipidemia. Current diabetic treatment includes oral agent (monotherapy). She is compliant with treatment all of the time. Her weight is stable. She is following a generally healthy diet. Her breakfast blood glucose is taken between 8-9 am. Her breakfast blood glucose range is generally 140-180 mg/dl. An ACE inhibitor/angiotensin II receptor blocker is being taken.  Hypertension  This is a chronic problem. The current episode started more than 1 year ago. The problem has been waxing and waning since onset. The problem is controlled (recent change in medication). Pertinent negatives include no blurred vision, chest pain, headaches, malaise/fatigue, neck pain, palpitations, shortness of breath or sweats. Risk factors for coronary artery disease include diabetes mellitus. Past treatments include angiotensin  blockers. Improvement on treatment: mild elevation on new dosage. There is no history of angina, kidney disease, CAD/MI, CVA, heart failure, left ventricular hypertrophy, PVD or renovascular disease. There is no history of chronic renal disease or a hypertension causing med.    No problem-specific Assessment & Plan notes found for this encounter.   Past Medical History:  Diagnosis Date  . Anxiety   . 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 Diabetes mellitus without complication ([place])   . Hyperlipidemia   . Hypertension     Past Surgical History:  Procedure Laterality Date  . BREAST BIOPSY Left 2011   neg almost in cleavage  . COLONOSCOPY  10/17/2014   cleared for 3 years/ polyps- Dr Allen Norris  . CYSTOCELE REPAIR    . OOPHORECTOMY Bilateral 1971  . VAGINAL HYSTERECTOMY      Family History  Problem Relation Age of Onset  . Cancer Mother   . Diabetes Mother   . Diabetes Father   . Diabetes Maternal Grandfather   . Stroke Maternal Grandfather   . Diabetes Paternal Grandfather   . Breast cancer Neg Hx     Social History   Social History  . Marital status: Widowed    Spouse name: N/A  . Number of children: N/A  . Years of education: N/A   Occupational History  . Not on file.   Social History Main Topics  . Smoking status: Former Research scientist (life sciences)  . Smokeless tobacco: Never Used  . Alcohol use 0.0 oz/week  . Drug use: No  . Sexual activity: No   Other Topics Concern  . Not on file   Social History Narrative  . No narrative on file    Allergies  Allergen Reactions  . Metronidazole Anaphylaxis  .  Penicillins Anaphylaxis and Hives  . Tetracyclines & Related Hives     Review of Systems  Constitutional: Negative for chills, fatigue, fever, malaise/fatigue and weight loss.  HENT: Negative for ear discharge, ear pain and sore throat.   Eyes: Negative for blurred vision.  Respiratory: Negative for cough, sputum production, shortness of breath and wheezing.   Cardiovascular:  Negative for chest pain, palpitations and leg swelling.  Gastrointestinal: Negative for abdominal pain, blood in stool, constipation, diarrhea, heartburn, melena and nausea.  Genitourinary: Negative for dysuria, frequency, hematuria, impotence and urgency.  Musculoskeletal: Negative for back pain, joint pain, myalgias and neck pain.  Skin: Negative for pallor and rash.  Neurological: Negative for dizziness, tingling, tremors, sensory change, focal weakness, seizures, speech difficulty, weakness and headaches.  Endo/Heme/Allergies: Negative for environmental allergies, polydipsia and polyphagia. Does not bruise/bleed easily.  Psychiatric/Behavioral: Negative for confusion, depression and suicidal ideas. The patient is not nervous/anxious and does not have insomnia.      Objective  Vitals:   09/16/16 1452  BP: 134/82  Pulse: 60  Weight: 171 lb (77.6 kg)  Height: 5\' 5"  (1.651 m)    Physical Exam  Constitutional: She is well-developed, well-nourished, and in no distress. No distress.  HENT:  Head: Normocephalic and atraumatic.  Right Ear: External ear normal.  Left Ear: External ear normal.  Nose: Nose normal.  Mouth/Throat: Oropharynx is clear and moist.  Eyes: Conjunctivae and EOM are normal. Pupils are equal, round, and reactive to light. Right eye exhibits no discharge. Left eye exhibits no discharge.  Neck: Normal range of motion. Neck supple. No JVD present. No thyromegaly present.  Cardiovascular: Normal rate, regular rhythm, normal heart sounds and intact distal pulses.  Exam reveals no gallop and no friction rub.   No murmur heard. Pulmonary/Chest: Effort normal and breath sounds normal. She has no wheezes. She has no rales.  Abdominal: Soft. Bowel sounds are normal. She exhibits no mass. There is no tenderness. There is no guarding.  Musculoskeletal: Normal range of motion. She exhibits no edema.  Lymphadenopathy:    She has no cervical adenopathy.  Neurological: She is  alert. She has normal reflexes.  Skin: Skin is warm and dry. She is not diaphoretic.  Psychiatric: Mood and affect normal.  Nursing note and vitals reviewed.     Assessment & Plan  Problem List Items Addressed This Visit      Endocrine   Type 2 diabetes mellitus without complication, without long-term current use of insulin (HCC) - Primary   Relevant Medications   losartan (COZAAR) 50 MG tablet   glipiZIDE (GLIPIZIDE XL) 5 MG 24 hr tablet    Other Visit Diagnoses    Hypertension associated with diabetes (HCC)       Relevant Medications   losartan (COZAAR) 50 MG tablet   carvedilol (COREG) 25 MG tablet   amLODipine (NORVASC) 10 MG tablet   glipiZIDE (GLIPIZIDE XL) 5 MG 24 hr tablet   Essential hypertension       Relevant Medications   losartan (COZAAR) 50 MG tablet   carvedilol (COREG) 25 MG tablet   amLODipine (NORVASC) 10 MG tablet        Dr. Analiz [facility]  09/16/16

## 2016-11-29 DIAGNOSIS — R188 Other ascites: Secondary | ICD-10-CM | POA: Diagnosis not present

## 2016-11-29 DIAGNOSIS — R000 Tachycardia, unspecified: Secondary | ICD-10-CM | POA: Diagnosis not present

## 2016-11-29 DIAGNOSIS — L0390 Cellulitis, unspecified: Secondary | ICD-10-CM | POA: Insufficient documentation

## 2016-11-29 DIAGNOSIS — Z880 Allergy status to penicillin: Secondary | ICD-10-CM | POA: Diagnosis not present

## 2016-11-29 DIAGNOSIS — L02214 Cutaneous abscess of groin: Secondary | ICD-10-CM | POA: Diagnosis not present

## 2016-11-29 DIAGNOSIS — J309 Allergic rhinitis, unspecified: Secondary | ICD-10-CM | POA: Diagnosis not present

## 2016-11-29 DIAGNOSIS — L0231 Cutaneous abscess of buttock: Secondary | ICD-10-CM | POA: Diagnosis not present

## 2016-11-29 DIAGNOSIS — J45909 Unspecified asthma, uncomplicated: Secondary | ICD-10-CM | POA: Diagnosis not present

## 2016-11-29 DIAGNOSIS — L03317 Cellulitis of buttock: Secondary | ICD-10-CM | POA: Diagnosis not present

## 2016-11-29 DIAGNOSIS — L539 Erythematous condition, unspecified: Secondary | ICD-10-CM | POA: Diagnosis not present

## 2016-11-29 DIAGNOSIS — E119 Type 2 diabetes mellitus without complications: Secondary | ICD-10-CM | POA: Diagnosis not present

## 2016-11-29 DIAGNOSIS — Z7984 Long term (current) use of oral hypoglycemic drugs: Secondary | ICD-10-CM | POA: Diagnosis not present

## 2016-11-29 DIAGNOSIS — L02211 Cutaneous abscess of abdominal wall: Secondary | ICD-10-CM | POA: Diagnosis not present

## 2016-11-29 DIAGNOSIS — I10 Essential (primary) hypertension: Secondary | ICD-10-CM | POA: Diagnosis not present

## 2016-11-29 DIAGNOSIS — Z8249 Family history of ischemic heart disease and other diseases of the circulatory system: Secondary | ICD-10-CM | POA: Diagnosis not present

## 2016-11-29 DIAGNOSIS — E7800 Pure hypercholesterolemia, unspecified: Secondary | ICD-10-CM | POA: Diagnosis not present

## 2016-11-29 DIAGNOSIS — J984 Other disorders of lung: Secondary | ICD-10-CM | POA: Diagnosis not present

## 2016-11-29 DIAGNOSIS — R918 Other nonspecific abnormal finding of lung field: Secondary | ICD-10-CM | POA: Diagnosis not present

## 2016-11-29 DIAGNOSIS — Z87891 Personal history of nicotine dependence: Secondary | ICD-10-CM | POA: Diagnosis not present

## 2016-11-29 DIAGNOSIS — Z7982 Long term (current) use of aspirin: Secondary | ICD-10-CM | POA: Diagnosis not present

## 2016-11-29 DIAGNOSIS — R0902 Hypoxemia: Secondary | ICD-10-CM | POA: Diagnosis not present

## 2016-11-29 DIAGNOSIS — L02215 Cutaneous abscess of perineum: Secondary | ICD-10-CM | POA: Diagnosis not present

## 2016-11-29 DIAGNOSIS — L03311 Cellulitis of abdominal wall: Secondary | ICD-10-CM | POA: Diagnosis not present

## 2016-11-29 DIAGNOSIS — E785 Hyperlipidemia, unspecified: Secondary | ICD-10-CM | POA: Diagnosis not present

## 2016-12-01 ENCOUNTER — Ambulatory Visit: Payer: Medicare Other | Admitting: Family Medicine

## 2016-12-04 DIAGNOSIS — Z7984 Long term (current) use of oral hypoglycemic drugs: Secondary | ICD-10-CM | POA: Diagnosis not present

## 2016-12-04 DIAGNOSIS — Z87891 Personal history of nicotine dependence: Secondary | ICD-10-CM | POA: Diagnosis not present

## 2016-12-04 DIAGNOSIS — B9562 Methicillin resistant Staphylococcus aureus infection as the cause of diseases classified elsewhere: Secondary | ICD-10-CM | POA: Diagnosis not present

## 2016-12-04 DIAGNOSIS — Z4800 Encounter for change or removal of nonsurgical wound dressing: Secondary | ICD-10-CM | POA: Diagnosis not present

## 2016-12-04 DIAGNOSIS — L0390 Cellulitis, unspecified: Secondary | ICD-10-CM | POA: Diagnosis not present

## 2016-12-04 DIAGNOSIS — L0231 Cutaneous abscess of buttock: Secondary | ICD-10-CM | POA: Diagnosis not present

## 2016-12-04 DIAGNOSIS — E119 Type 2 diabetes mellitus without complications: Secondary | ICD-10-CM | POA: Diagnosis not present

## 2016-12-04 DIAGNOSIS — I10 Essential (primary) hypertension: Secondary | ICD-10-CM | POA: Diagnosis not present

## 2016-12-04 DIAGNOSIS — L02211 Cutaneous abscess of abdominal wall: Secondary | ICD-10-CM | POA: Diagnosis not present

## 2016-12-06 DIAGNOSIS — Z87891 Personal history of nicotine dependence: Secondary | ICD-10-CM | POA: Diagnosis not present

## 2016-12-06 DIAGNOSIS — E119 Type 2 diabetes mellitus without complications: Secondary | ICD-10-CM | POA: Diagnosis not present

## 2016-12-06 DIAGNOSIS — I10 Essential (primary) hypertension: Secondary | ICD-10-CM | POA: Diagnosis not present

## 2016-12-06 DIAGNOSIS — B9562 Methicillin resistant Staphylococcus aureus infection as the cause of diseases classified elsewhere: Secondary | ICD-10-CM | POA: Diagnosis not present

## 2016-12-06 DIAGNOSIS — L02211 Cutaneous abscess of abdominal wall: Secondary | ICD-10-CM | POA: Diagnosis not present

## 2016-12-06 DIAGNOSIS — Z4800 Encounter for change or removal of nonsurgical wound dressing: Secondary | ICD-10-CM | POA: Diagnosis not present

## 2016-12-06 DIAGNOSIS — L0390 Cellulitis, unspecified: Secondary | ICD-10-CM | POA: Diagnosis not present

## 2016-12-06 DIAGNOSIS — L0231 Cutaneous abscess of buttock: Secondary | ICD-10-CM | POA: Diagnosis not present

## 2016-12-06 DIAGNOSIS — Z7984 Long term (current) use of oral hypoglycemic drugs: Secondary | ICD-10-CM | POA: Diagnosis not present

## 2016-12-09 ENCOUNTER — Other Ambulatory Visit: Payer: Self-pay | Admitting: Family Medicine

## 2016-12-09 DIAGNOSIS — E119 Type 2 diabetes mellitus without complications: Secondary | ICD-10-CM

## 2016-12-09 DIAGNOSIS — J40 Bronchitis, not specified as acute or chronic: Secondary | ICD-10-CM

## 2016-12-10 DIAGNOSIS — I10 Essential (primary) hypertension: Secondary | ICD-10-CM | POA: Diagnosis not present

## 2016-12-10 DIAGNOSIS — L0390 Cellulitis, unspecified: Secondary | ICD-10-CM | POA: Diagnosis not present

## 2016-12-10 DIAGNOSIS — E119 Type 2 diabetes mellitus without complications: Secondary | ICD-10-CM | POA: Diagnosis not present

## 2016-12-10 DIAGNOSIS — L0231 Cutaneous abscess of buttock: Secondary | ICD-10-CM | POA: Diagnosis not present

## 2016-12-10 DIAGNOSIS — L02211 Cutaneous abscess of abdominal wall: Secondary | ICD-10-CM | POA: Diagnosis not present

## 2016-12-10 DIAGNOSIS — B9562 Methicillin resistant Staphylococcus aureus infection as the cause of diseases classified elsewhere: Secondary | ICD-10-CM | POA: Diagnosis not present

## 2016-12-10 DIAGNOSIS — Z87891 Personal history of nicotine dependence: Secondary | ICD-10-CM | POA: Diagnosis not present

## 2016-12-10 DIAGNOSIS — Z7984 Long term (current) use of oral hypoglycemic drugs: Secondary | ICD-10-CM | POA: Diagnosis not present

## 2016-12-10 DIAGNOSIS — Z4800 Encounter for change or removal of nonsurgical wound dressing: Secondary | ICD-10-CM | POA: Diagnosis not present

## 2016-12-14 ENCOUNTER — Ambulatory Visit (INDEPENDENT_AMBULATORY_CARE_PROVIDER_SITE_OTHER): Payer: Medicare Other | Admitting: Family Medicine

## 2016-12-14 VITALS — BP 138/80 | HR 64 | Ht 65.0 in | Wt 173.0 lb

## 2016-12-14 DIAGNOSIS — Z9889 Other specified postprocedural states: Secondary | ICD-10-CM

## 2016-12-14 DIAGNOSIS — Z09 Encounter for follow-up examination after completed treatment for conditions other than malignant neoplasm: Secondary | ICD-10-CM

## 2016-12-14 DIAGNOSIS — J449 Chronic obstructive pulmonary disease, unspecified: Secondary | ICD-10-CM

## 2016-12-14 DIAGNOSIS — E119 Type 2 diabetes mellitus without complications: Secondary | ICD-10-CM | POA: Diagnosis not present

## 2016-12-14 NOTE — Progress Notes (Signed)
Name: Sara Atkinson   MRN: [id number]    DOB: 09-07-1945   Date:12/14/2016       Progress Note  Subjective  Chief Complaint  Chief Complaint  Patient presents with  . Follow-up    was discharged with cellulitis- place on bottom has healed and still has a small place on "belly"    Patient followup from hospitalization for sepsis/methicillin resistance. Patient continues to improve.  Copd stable.  Infection stable. Diabetes stable(FBS 120-130)    No problem-specific Assessment & Plan notes found for this encounter.   Past Medical History:  Diagnosis Date  . Anxiety   . 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 Diabetes mellitus without complication ([place])   . Hyperlipidemia   . Hypertension     Past Surgical History:  Procedure Laterality Date  . BREAST BIOPSY Left 2011   neg almost in cleavage  . COLONOSCOPY  10/17/2014   cleared for 3 years/ polyps- Dr Allen Norris  . CYSTOCELE REPAIR    . OOPHORECTOMY Bilateral 1971  . VAGINAL HYSTERECTOMY      Family History  Problem Relation Age of Onset  . Cancer Mother   . Diabetes Mother   . Diabetes Father   . Diabetes Maternal Grandfather   . Stroke Maternal Grandfather   . Diabetes Paternal Grandfather   . Breast cancer Neg Hx     Social History   Social History  . Marital status: Widowed    Spouse name: N/A  . Number of children: N/A  . Years of education: N/A   Occupational History  . Not on file.   Social History Main Topics  . Smoking status: Former Research scientist (life sciences)  . Smokeless tobacco: Never Used  . Alcohol use 0.0 oz/week  . Drug use: No  . Sexual activity: No   Other Topics Concern  . Not on file   Social History Narrative  . No narrative on file    Allergies  Allergen Reactions  . Metronidazole Anaphylaxis  . Penicillins Anaphylaxis and Hives  . Tetracyclines & Related Hives    Outpatient Medications Prior to Visit  Medication Sig Dispense Refill  . albuterol (PROVENTIL HFA;VENTOLIN HFA) 108 (90 Base) MCG/ACT  inhaler Inhale 2 puffs into the lungs every 6 (six) hours as needed for wheezing or shortness of breath. 1 Inhaler 0  . amLODipine (NORVASC) 10 MG tablet Take 1 tablet (10 mg total) by mouth daily. 90 tablet 1  . aspirin 81 MG tablet Take 81 mg by mouth daily.    Marland Kitchen BIOTIN 5000 PO Take 1 capsule by mouth daily.    . carvedilol (COREG) 25 MG tablet Take 1 tablet (25 mg total) by mouth 2 (two) times daily. 180 tablet 1  . Cholecalciferol (VITAMIN D3) 5000 UNITS CAPS Take 1 capsule by mouth daily.    Marland Kitchen glipiZIDE (GLIPIZIDE XL) 5 MG 24 hr tablet Take 1 tablet (5 mg total) by mouth daily with breakfast. 30 tablet 2  . glucose blood test strip One a day testing 100 each 1  . losartan (COZAAR) 50 MG tablet Take 1 tablet (50 mg total) by mouth daily. 90 tablet 1  . lovastatin (MEVACOR) 40 MG tablet Take 1 tablet (40 mg total) by mouth daily. 90 tablet 1  . metFORMIN (GLUCOPHAGE) 1000 MG tablet TAKE ONE TABLET BY MOUTH TWICE DAILY 180 tablet 0  . naproxen (NAPROSYN) 500 MG tablet Take 500 mg by mouth as needed. otc    . Omega-3 300 MG CAPS Take  1 capsule by mouth daily. 60 capsule 11   No facility-administered medications prior to visit.     Review of Systems  Constitutional: Negative for chills, fever, malaise/fatigue and weight loss.  HENT: Negative for ear discharge, ear pain and sore throat.   Eyes: Negative for blurred vision.  Respiratory: Negative for cough, sputum production, shortness of breath and wheezing.   Cardiovascular: Negative for chest pain, palpitations and leg swelling.  Gastrointestinal: Negative for abdominal pain, blood in stool, constipation, diarrhea, heartburn, melena and nausea.  Genitourinary: Negative for dysuria, frequency, hematuria and urgency.  Musculoskeletal: Negative for back pain, joint pain, myalgias and neck pain.  Skin: Negative for rash.  Neurological: Negative for dizziness, tingling, sensory change, focal weakness and headaches.  Endo/Heme/Allergies:  Negative for environmental allergies and polydipsia. Does not bruise/bleed easily.  Psychiatric/Behavioral: Negative for depression and suicidal ideas. The patient is not nervous/anxious and does not have insomnia.      Objective  Vitals:   12/14/16 1405  BP: 138/80  Pulse: 64  Weight: 173 lb (78.5 kg)  Height: 5\' 5"  (1.651 m)    Physical Exam  Constitutional: She is well-developed, well-nourished, and in no distress. No distress.  HENT:  Head: Normocephalic and atraumatic.  Right Ear: External ear normal.  Left Ear: External ear normal.  Nose: Nose normal.  Mouth/Throat: Oropharynx is clear and moist.  Eyes: Conjunctivae and EOM are normal. Pupils are equal, round, and reactive to light. Right eye exhibits no discharge. Left eye exhibits no discharge.  Neck: Normal range of motion. Neck supple. No JVD present. No thyromegaly present.  Cardiovascular: Normal rate, regular rhythm, normal heart sounds and intact distal pulses.  Exam reveals no gallop and no friction rub.   No murmur heard. Pulmonary/Chest: Effort normal and breath sounds normal. She has no wheezes. She has no rales.  Abdominal: Soft. Bowel sounds are normal. She exhibits no mass. There is no tenderness. There is no guarding.  Musculoskeletal: Normal range of motion. She exhibits no edema.  Lymphadenopathy:    She has no cervical adenopathy.  Neurological: She is alert. She has normal reflexes.  Skin: Skin is warm and dry. No rash noted. She is not diaphoretic. No erythema.     Psychiatric: Mood and affect normal.  Nursing note and vitals reviewed.     Assessment & Plan  Problem List Items Addressed This Visit      Respiratory   Chronic obstructive pulmonary disease ([place])     Endocrine   Type 2 diabetes mellitus without complication, without long-term current use of insulin ([place])    Other Visit Diagnoses    Hospital discharge follow-up    -  Primary   Status post incision and drainage       redress  inguinal wound      No orders of the defined types were placed in this encounter.     Dr. Mac[facility]  12/14/16

## 2016-12-24 DIAGNOSIS — H04123 Dry eye syndrome of bilateral lacrimal glands: Secondary | ICD-10-CM | POA: Diagnosis not present

## 2016-12-24 DIAGNOSIS — H0289 Other specified disorders of eyelid: Secondary | ICD-10-CM | POA: Diagnosis not present

## 2016-12-24 DIAGNOSIS — H40003 Preglaucoma, unspecified, bilateral: Secondary | ICD-10-CM | POA: Diagnosis not present

## 2017-01-10 ENCOUNTER — Telehealth: Payer: Self-pay | Admitting: Family Medicine

## 2017-01-10 NOTE — Telephone Encounter (Signed)
Move AWV to 1:30pm on 6/11 lm for pt

## 2017-01-11 NOTE — Telephone Encounter (Signed)
Pt returned call and lm w/okay to move to 1:30 on 6/11 -knb

## 2017-01-17 ENCOUNTER — Ambulatory Visit (INDEPENDENT_AMBULATORY_CARE_PROVIDER_SITE_OTHER): Payer: Medicare Other

## 2017-01-17 ENCOUNTER — Telehealth: Payer: Self-pay

## 2017-01-17 VITALS — BP 132/72 | HR 72 | Temp 98.4°F | Resp 16 | Ht 65.0 in | Wt 173.0 lb

## 2017-01-17 DIAGNOSIS — Z1159 Encounter for screening for other viral diseases: Secondary | ICD-10-CM | POA: Diagnosis not present

## 2017-01-17 DIAGNOSIS — Z0000 Encounter for general adult medical examination without abnormal findings: Secondary | ICD-10-CM | POA: Diagnosis not present

## 2017-01-17 NOTE — Progress Notes (Signed)
Subjective:   Sara Atkinson is a 71 y.o. female who presents for Medicare Annual (Subsequent) preventive examination.  Review of Systems:   Cardiac Risk Factors include: advanced age (>3men, >41 women);diabetes mellitus;hypertension     Objective:     Vitals: BP 132/72 (BP Location: Right Arm, Patient Position: Sitting)   Pulse 72   Temp 98.4 F (36.9 C)   Resp 16   Ht 5\' 5"  (1.651 m)   Wt 173 lb (78.5 kg)   BMI 28.79 kg/m   Body mass index is 28.79 kg/m.   Tobacco History  Smoking Status  . Former Smoker  . Quit date: 12/08/2011  Smokeless Tobacco  . Never Used     Counseling given: Not Answered   Past Medical History:  Diagnosis Date  . Anxiety   . 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 Diabetes mellitus without complication ([place])   . Hyperlipidemia   . Hypertension    Past Surgical History:  Procedure Laterality Date  . BREAST BIOPSY Left 2011   neg almost in cleavage  . COLONOSCOPY  10/17/2014   cleared for 3 years/ polyps- Dr Allen Norris  . CYSTOCELE REPAIR    . OOPHORECTOMY Bilateral 1971  . VAGINAL HYSTERECTOMY     Family History  Problem Relation Age of Onset  . Cancer Mother   . Diabetes Mother   . Diabetes Father   . Diabetes Maternal Grandfather   . Stroke Maternal Grandfather   . Diabetes Paternal Grandfather   . Breast cancer Neg Hx    History  Sexual Activity  . Sexual activity: No    Outpatient Encounter Prescriptions as of 01/17/2017  Medication Sig  . albuterol (PROVENTIL HFA;VENTOLIN HFA) 108 (90 Base) MCG/ACT inhaler Inhale 2 puffs into the lungs every 6 (six) hours as needed for wheezing or shortness of breath.  Marland Kitchen amLODipine (NORVASC) 10 MG tablet Take 1 tablet (10 mg total) by mouth daily.  Marland Kitchen aspirin 81 MG tablet Take 81 mg by mouth daily.  Marland Kitchen BIOTIN 5000 PO Take 1 capsule by mouth daily.  . carvedilol (COREG) 25 MG tablet Take 1 tablet (25 mg total) by mouth 2 (two) times daily.  . Cholecalciferol (VITAMIN D3) 5000 UNITS CAPS Take 1  capsule by mouth daily.  Marland Kitchen glipiZIDE (GLIPIZIDE XL) 5 MG 24 hr tablet Take 1 tablet (5 mg total) by mouth daily with breakfast.  . glucose blood test strip One a day testing  . losartan (COZAAR) 50 MG tablet Take 1 tablet (50 mg total) by mouth daily.  Marland Kitchen lovastatin (MEVACOR) 40 MG tablet Take 1 tablet (40 mg total) by mouth daily.  . metFORMIN (GLUCOPHAGE) 1000 MG tablet TAKE ONE TABLET BY MOUTH TWICE DAILY  . Omega-3 300 MG CAPS Take 1 capsule by mouth daily.  . naproxen (NAPROSYN) 500 MG tablet Take 500 mg by mouth as needed. otc   No facility-administered encounter medications on file as of 01/17/2017.     Activities of Daily Living In your present state of health, do you have any difficulty performing the following activities: 01/17/2017  Hearing? N  Vision? N  Difficulty concentrating or making decisions? N  Walking or climbing stairs? N  Dressing or bathing? N  Doing errands, shopping? N  Preparing Food and eating ? N  Using the Toilet? N  In the past six months, have you accidently leaked urine? Y  Do you have problems with loss of bowel control? N  Managing your Medications? N  Managing your Finances? N  Housekeeping or managing your Housekeeping? N  Some recent data might be hidden    Patient Care Team: Juline Patch, MD as PCP - General (Family Medicine)    Assessment:     Exercise Activities and Dietary recommendations Current Exercise Habits: Home exercise routine, Time (Minutes): > 60, Frequency (Times/Week): 4, Weekly Exercise (Minutes/Week): 0, Intensity: Mild, Exercise limited by: None identified  Goals    . Increase water intake          Recommend drinking at least 4-5 glasses of water a day       Fall Risk Fall Risk  01/17/2017 01/07/2016 11/25/2015 10/14/2015 08/28/2015  Falls in the past year? Yes No No No Yes  Number falls in past yr: 1 - - - 1  Injury with Fall? No - - - No  Risk for fall due to : History of fall(s) - - - -  Follow up - - - - Falls  evaluation completed   Depression Screen PHQ 2/9 Scores 01/17/2017 01/07/2016 11/25/2015 10/14/2015  PHQ - 2 Score 0 0 0 0     Cognitive Function     6CIT Screen 01/17/2017  What Year? 0 points  What month? 0 points  What time? 0 points  Count back from 20 0 points  Months in reverse 0 points  Repeat phrase 0 points  Total Score 0    Immunization History  Administered Date(s) Administered  . Influenza-Unspecified 05/26/2016  . Pneumococcal Conjugate-13 01/28/2014, 10/14/2015  . Pneumococcal Polysaccharide-23 06/26/2012  . Tdap 10/28/2011   Screening Tests Health Maintenance  Topic Date Due  . FOOT EXAM  01/25/2017 (Originally 06/26/1956)  . Hepatitis C Screening  01/25/2017 (Originally 07-10-1946)  . PNA vac Low Risk Adult (2 of 2 - PPSV23) 06/09/2017 (Originally 10/13/2016)  . HEMOGLOBIN A1C  02/28/2017  . INFLUENZA VACCINE  03/09/2017  . OPHTHALMOLOGY EXAM  12/07/2017  . MAMMOGRAM  05/11/2018  . TETANUS/TDAP  08/09/2022  . COLONOSCOPY  10/16/2024  . DEXA SCAN  Completed      Plan:    I have personally reviewed and addressed the Medicare Annual Wellness questionnaire and have noted the following in the patient's chart:  A. Medical and social history B. Use of alcohol, tobacco or illicit drugs  C. Current medications and supplements D. Functional ability and status E.  Nutritional status F.  Physical activity G. Advance directives H. List of other physicians I.  Hospitalizations, surgeries, and ER visits in previous 12 months J.  [place] such as hearing and vision if needed, cognitive and depression L. Referrals and appointments   In addition, I have reviewed and discussed with patient certain preventive protocols, quality metrics, and best practice recommendations. A written personalized care plan for preventive services as well as general preventive health recommendations were provided to patient.   Signed,  Tyler Aas, LPN Nurse Health  Advisor   MD Recommendations: Patient reported a rash on her chest and bilateral arms that appeared after her shower this morning, she denies using any new soaps or detergents. Slight redness noted on chest, nothing noted on either arm. Started Metformin on 5/3  Needs diabetic foot exam at next visit

## 2017-01-17 NOTE — Patient Instructions (Addendum)
[name] , Thank you for taking time to come for your Medicare Wellness Visit. I appreciate your ongoing commitment to your health goals. Please review the following plan we discussed and let me know if I can assist you in the future.   Screening recommendations/referrals: Colonoscopy: Completed 10/17/2014 Mammogram: Completed 05/11/2016 Bone Density: Completed 05/09/2015 Recommended yearly ophthalmology/optometry visit for glaucoma screening and checkup Recommended yearly dental visit for hygiene and checkup  Vaccinations: Influenza vaccine: up to date, due 05/2017 Pneumococcal vaccine: Pneumovax 23 due  Tdap vaccine: up to date  Shingles vaccine: due check with your insurance company for coverage   Advanced directives: Please bring a copy at your convenience.   Conditions/risks identified: Recommend drinking at least 4-5 glasses of water a day   Next appointment: Follow up with Dr.Jones on 01/25/2017 at 8:15am. Follow up in one year for your annual wellness exam.   Preventive Care 65 Years and Older, Female Preventive care refers to lifestyle choices and visits with your health care provider that can promote health and wellness. What does preventive care include?  A yearly physical exam. This is also called an annual well check.  Dental exams once or twice a year.  Routine eye exams. Ask your health care provider how often you should have your eyes checked.  Personal lifestyle choices, including:  Daily care of your teeth and gums.  Regular physical activity.  Eating a healthy diet.  Avoiding tobacco and drug use.  Limiting alcohol use.  Practicing safe sex.  Taking low-dose aspirin every day.  Taking vitamin and mineral supplements as recommended by your health care provider. What happens during an annual well check? The services and screenings done by your health care provider during your annual well check will depend on your age, overall health, lifestyle risk  factors, and family history of disease. Counseling  Your health care provider may ask you questions about your:  Alcohol use.  Tobacco use.  Drug use.  Emotional well-being.  Home and relationship well-being.  Sexual activity.  Eating habits.  History of falls.  Memory and ability to understand (cognition).  Work and work Statistician.  Reproductive health. Screening  You may have the following tests or measurements:  Height, weight, and BMI.  Blood pressure.  Lipid and cholesterol levels. These may be checked every 5 years, or more frequently if you are over 8 years old.  Skin check.  Lung cancer screening. You may have this screening every year starting at age 48 if you have a 30-pack-year history of smoking and currently smoke or have quit within the past 15 years.  Fecal occult blood test (FOBT) of the stool. You may have this test every year starting at age 55.  Flexible sigmoidoscopy or colonoscopy. You may have a sigmoidoscopy every 5 years or a colonoscopy every 10 years starting at age 88.  Hepatitis C blood test.  Hepatitis B blood test.  Sexually transmitted disease (STD) testing.  Diabetes screening. This is done by checking your blood sugar (glucose) after you have not eaten for a while (fasting). You may have this done every 1-3 years.  Bone density scan. This is done to screen for osteoporosis. You may have this done starting at age 9.  Mammogram. This may be done every 1-2 years. Talk to your health care provider about how often you should have regular mammograms. Talk with your health care provider about your test results, treatment options, and if necessary, the need for more tests. Vaccines  Your health care provider may recommend certain vaccines, such as:  Influenza vaccine. This is recommended every year.  Tetanus, diphtheria, and acellular pertussis (Tdap, Td) vaccine. You may need a Td booster every 10 years.  Zoster vaccine. You  may need this after age 26.  Pneumococcal 13-valent conjugate (PCV13) vaccine. One dose is recommended after age 24.  Pneumococcal polysaccharide (PPSV23) vaccine. One dose is recommended after age 3. Talk to your health care provider about which screenings and vaccines you need and how often you need them. This information is not intended to replace advice given to you by your health care provider. Make sure you discuss any questions you have with your health care provider. Document Released: 08/22/2015 Document Revised: 04/14/2016 Document Reviewed: 05/27/2015 Elsevier Interactive Patient Education  2017 [place] Prevention in the Home Falls can cause injuries. They can happen to people of all ages. There are many things you can do to make your home safe and to help prevent falls. What can I do on the outside of my home?  Regularly fix the edges of walkways and driveways and fix any cracks.  Remove anything that might make you trip as you walk through a door, such as a raised step or threshold.  Trim any bushes or trees on the path to your home.  Use bright outdoor lighting.  Clear any walking paths of anything that might make someone trip, such as rocks or tools.  Regularly check to see if handrails are loose or broken. Make sure that both sides of any steps have handrails.  Any raised decks and porches should have guardrails on the edges.  Have any leaves, snow, or ice cleared regularly.  Use sand or salt on walking paths during winter.  Clean up any spills in your garage right away. This includes oil or grease spills. What can I do in the bathroom?  Use night lights.  Install grab bars by the toilet and in the tub and shower. Do not use towel bars as grab bars.  Use non-skid mats or decals in the tub or shower.  If you need to sit down in the shower, use a plastic, non-slip stool.  Keep the floor dry. Clean up any water that spills on the floor as soon as  it happens.  Remove soap buildup in the tub or shower regularly.  Attach bath mats securely with double-sided non-slip rug tape.  Do not have throw rugs and other things on the floor that can make you trip. What can I do in the bedroom?  Use night lights.  Make sure that you have a light by your bed that is easy to reach.  Do not use any sheets or blankets that are too big for your bed. They should not hang down onto the floor.  Have a firm chair that has side arms. You can use this for support while you get dressed.  Do not have throw rugs and other things on the floor that can make you trip. What can I do in the kitchen?  Clean up any spills right away.  Avoid walking on wet floors.  Keep items that you use a lot in easy-to-reach places.  If you need to reach something above you, use a strong step stool that has a grab bar.  Keep electrical cords out of the way.  Do not use floor polish or wax that makes floors slippery. If you must use wax, use non-skid floor wax.  Do  not have throw rugs and other things on the floor that can make you trip. What can I do with my stairs?  Do not leave any items on the stairs.  Make sure that there are handrails on both sides of the stairs and use them. Fix handrails that are broken or loose. Make sure that handrails are as long as the stairways.  Check any carpeting to make sure that it is firmly attached to the stairs. Fix any carpet that is loose or worn.  Avoid having throw rugs at the top or bottom of the stairs. If you do have throw rugs, attach them to the floor with carpet tape.  Make sure that you have a light switch at the top of the stairs and the bottom of the stairs. If you do not have them, ask someone to add them for you. What else can I do to help prevent falls?  Wear shoes that:  Do not have high heels.  Have rubber bottoms.  Are comfortable and fit you well.  Are closed at the toe. Do not wear sandals.  If  you use a stepladder:  Make sure that it is fully opened. Do not climb a closed stepladder.  Make sure that both sides of the stepladder are locked into place.  Ask someone to hold it for you, if possible.  Clearly mark and make sure that you can see:  Any grab bars or handrails.  First and last steps.  Where the edge of each step is.  Use tools that help you move around (mobility aids) if they are needed. These include:  Canes.  Walkers.  Scooters.  Crutches.  Turn on the lights when you go into a dark area. Replace any light bulbs as soon as they burn out.  Set up your furniture so you have a clear path. Avoid moving your furniture around.  If any of your floors are uneven, fix them.  If there are any pets around you, be aware of where they are.  Review your medicines with your doctor. Some medicines can make you feel dizzy. This can increase your chance of falling. Ask your doctor what other things that you can do to help prevent falls. This information is not intended to replace advice given to you by your health care provider. Make sure you discuss any questions you have with your health care provider. Document Released: 05/22/2009 Document Revised: 01/01/2016 Document Reviewed: 08/30/2014 Elsevier Interactive Patient Education  2017 [employer].

## 2017-01-17 NOTE — Telephone Encounter (Signed)
Per Dr.Jones patient to stop metformin until she is seen on 01/25/2017. Called and informed patient. Ms.Kane verbalized understanding and denies any questions or concerns at this time.

## 2017-01-25 ENCOUNTER — Encounter: Payer: Self-pay | Admitting: Family Medicine

## 2017-01-25 ENCOUNTER — Ambulatory Visit (INDEPENDENT_AMBULATORY_CARE_PROVIDER_SITE_OTHER): Payer: Medicare Other | Admitting: Family Medicine

## 2017-01-25 ENCOUNTER — Other Ambulatory Visit
Admission: RE | Admit: 2017-01-25 | Discharge: 2017-01-25 | Disposition: A | Discharge status: Home / Self Care | Payer: Medicare Other | Source: Ambulatory Visit | Attending: Family Medicine | Admitting: Family Medicine

## 2017-01-25 VITALS — BP 120/62 | HR 60 | Ht 66.0 in | Wt 171.0 lb

## 2017-01-25 DIAGNOSIS — E119 Type 2 diabetes mellitus without complications: Secondary | ICD-10-CM

## 2017-01-25 DIAGNOSIS — Z1159 Encounter for screening for other viral diseases: Secondary | ICD-10-CM

## 2017-01-25 LAB — RENAL FUNCTION PANEL
Albumin: 4.6 g/dL (ref 3.5–5.0)
Anion gap: 10 (ref 5–15)
BUN: 19 mg/dL (ref 6–20)
CALCIUM: 9.9 mg/dL (ref 8.9–10.3)
CHLORIDE: 103 mmol/L (ref 101–111)
CO2: 28 mmol/L (ref 22–32)
CREATININE: 0.7 mg/dL (ref 0.44–1.00)
Glucose, Bld: 118 mg/dL — ABNORMAL HIGH (ref 65–99)
Phosphorus: 4.7 mg/dL — ABNORMAL HIGH (ref 2.5–4.6)
Potassium: 5.1 mmol/L (ref 3.5–5.1)
Sodium: 141 mmol/L (ref 135–145)

## 2017-01-25 NOTE — Progress Notes (Signed)
Name: Sara Atkinson   MRN: [id number]    DOB: [birth year]   Date:01/25/2017       Progress Note  Subjective  Chief Complaint  Chief Complaint  Patient presents with  . Diabetes    started itching so was told to stop metformin- BS "went crazy"- picked up some benadryl and hydrocortisone cream otc/ seemed to work and started back on Metformin. Numbers have slowly started to come back down.    Diabetes  She presents for her follow-up diabetic visit. She has type 2 diabetes mellitus. Her disease course has been stable. Pertinent negatives for hypoglycemia include no confusion, dizziness, headaches, hunger, mood changes, nervousness/anxiousness, pallor, seizures, sleepiness, speech difficulty, sweats or tremors. Pertinent negatives for diabetes include no blurred vision, no chest pain, no fatigue, no foot paresthesias, no foot ulcerations, no polydipsia, no polyphagia, no polyuria, no visual change, no weakness and no weight loss. Symptoms are stable. There are no known risk factors for coronary artery disease. Current diabetic treatment includes oral agent (dual therapy). She is compliant with treatment all of the time. Her weight is stable. She is following a generally healthy diet. Her home blood glucose trend is fluctuating minimally. Her breakfast blood glucose is taken between 8-9 am. Her breakfast blood glucose range is generally 110-130 mg/dl. An ACE inhibitor/angiotensin II receptor blocker is being taken. Eye exam is current.    No problem-specific Assessment & Plan notes found for this encounter.   Past Medical History:  Diagnosis Date  . Anxiety   . 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 Diabetes mellitus without complication ([place])   . Hyperlipidemia   . Hypertension     Past Surgical History:  Procedure Laterality Date  . BREAST BIOPSY Left 2011   neg almost in cleavage  . COLONOSCOPY  10/17/2014   cleared for 3 years/ polyps- Dr Allen Norris  . CYSTOCELE REPAIR    . OOPHORECTOMY Bilateral  1971  . VAGINAL HYSTERECTOMY      Family History  Problem Relation Age of Onset  . Cancer Mother   . Diabetes Mother   . Diabetes Father   . Diabetes Maternal Grandfather   . Stroke Maternal Grandfather   . Diabetes Paternal Grandfather   . Breast cancer Neg Hx     Social History   Social History  . Marital status: Widowed    Spouse name: N/A  . Number of children: N/A  . Years of education: N/A   Occupational History  . Not on file.   Social History Main Topics  . Smoking status: Former Smoker    Quit date: 12/08/2011  . Smokeless tobacco: Never Used  . Alcohol use No  . Drug use: No  . Sexual activity: No   Other Topics Concern  . Not on file   Social History Narrative  . No narrative on file    Allergies  Allergen Reactions  . Metronidazole Anaphylaxis  . Penicillins Anaphylaxis and Hives  . Tetracyclines & Related Hives    Outpatient Medications Prior to Visit  Medication Sig Dispense Refill  . albuterol (PROVENTIL HFA;VENTOLIN HFA) 108 (90 Base) MCG/ACT inhaler Inhale 2 puffs into the lungs every 6 (six) hours as needed for wheezing or shortness of breath. 1 Inhaler 0  . amLODipine (NORVASC) 10 MG tablet Take 1 tablet (10 mg total) by mouth daily. 90 tablet 1  . aspirin 81 MG tablet Take 81 mg by mouth daily.    Marland Kitchen BIOTIN 5000 PO Take 1 capsule  by mouth daily.    . carvedilol (COREG) 25 MG tablet Take 1 tablet (25 mg total) by mouth 2 (two) times daily. 180 tablet 1  . Cholecalciferol (VITAMIN D3) 5000 UNITS CAPS Take 1 capsule by mouth daily.    Marland Kitchen glipiZIDE (GLIPIZIDE XL) 5 MG 24 hr tablet Take 1 tablet (5 mg total) by mouth daily with breakfast. 30 tablet 2  . glucose blood test strip One a day testing 100 each 1  . losartan (COZAAR) 50 MG tablet Take 1 tablet (50 mg total) by mouth daily. 90 tablet 1  . lovastatin (MEVACOR) 40 MG tablet Take 1 tablet (40 mg total) by mouth daily. 90 tablet 1  . metFORMIN (GLUCOPHAGE) 1000 MG tablet TAKE ONE TABLET BY  MOUTH TWICE DAILY 180 tablet 0  . naproxen (NAPROSYN) 500 MG tablet Take 500 mg by mouth as needed. otc    . Omega-3 300 MG CAPS Take 1 capsule by mouth daily. 60 capsule 11   No facility-administered medications prior to visit.     Review of Systems  Constitutional: Negative for chills, fatigue, fever, malaise/fatigue and weight loss.  HENT: Negative for ear discharge, ear pain and sore throat.   Eyes: Negative for blurred vision.  Respiratory: Negative for cough, sputum production, shortness of breath and wheezing.   Cardiovascular: Negative for chest pain, palpitations and leg swelling.  Gastrointestinal: Negative for abdominal pain, blood in stool, constipation, diarrhea, heartburn, melena and nausea.  Genitourinary: Negative for dysuria, frequency, hematuria and urgency.  Musculoskeletal: Negative for back pain, joint pain, myalgias and neck pain.  Skin: Negative for pallor and rash.  Neurological: Negative for dizziness, tingling, tremors, sensory change, focal weakness, seizures, speech difficulty, weakness and headaches.  Endo/Heme/Allergies: Negative for environmental allergies, polydipsia and polyphagia. Does not bruise/bleed easily.  Psychiatric/Behavioral: Negative for confusion, depression and suicidal ideas. The patient is not nervous/anxious and does not have insomnia.      Objective  Vitals:   01/25/17 0831  BP: 120/62  Pulse: 60  Weight: 171 lb (77.6 kg)  Height: 5\' 6"  (1.676 m)    Physical Exam  Constitutional: She is well-developed, well-nourished, and in no distress. No distress.  HENT:  Head: Normocephalic and atraumatic.  Right Ear: External ear normal.  Left Ear: External ear normal.  Nose: Nose normal.  Mouth/Throat: Oropharynx is clear and moist.  Eyes: Conjunctivae and EOM are normal. Pupils are equal, round, and reactive to light. Right eye exhibits no discharge. Left eye exhibits no discharge.  Neck: Normal range of motion. Neck supple. No JVD  present. No thyromegaly present.  Cardiovascular: Normal rate, regular rhythm, normal heart sounds and intact distal pulses.  Exam reveals no gallop and no friction rub.   No murmur heard. Pulmonary/Chest: Effort normal and breath sounds normal. She has no wheezes. She has no rales.  Abdominal: Soft. Bowel sounds are normal. She exhibits no mass. There is no tenderness. There is no guarding.  Musculoskeletal: Normal range of motion. She exhibits no edema.  Lymphadenopathy:    She has no cervical adenopathy.  Neurological: She is alert. She has normal reflexes.  Skin: Skin is warm and dry. She is not diaphoretic.  Psychiatric: Mood and affect normal.  Nursing note and vitals reviewed.     Assessment & Plan  Problem List Items Addressed This Visit      Endocrine   Type 2 diabetes mellitus without complication, without long-term current use of insulin ([place]) - Primary   Relevant Orders  Hemoglobin A1c   Renal Function Panel    Other Visit Diagnoses    Need for hepatitis C screening test          No orders of the defined types were placed in this encounter.     Dr. [facility]  01/25/17

## 2017-01-26 LAB — HEMOGLOBIN A1C
Hgb A1c MFr Bld: 6.3 % — ABNORMAL HIGH (ref 4.8–5.6)
Mean Plasma Glucose: 134 mg/dL

## 2017-01-26 LAB — HEPATITIS C ANTIBODY: HCV Ab: 0.1 s/co ratio (ref 0.0–0.9)

## 2017-02-10 ENCOUNTER — Other Ambulatory Visit: Payer: Self-pay | Admitting: Family Medicine

## 2017-02-10 DIAGNOSIS — E119 Type 2 diabetes mellitus without complications: Secondary | ICD-10-CM

## 2017-02-21 ENCOUNTER — Other Ambulatory Visit: Payer: Self-pay

## 2017-03-07 ENCOUNTER — Other Ambulatory Visit: Payer: Self-pay | Admitting: Family Medicine

## 2017-03-07 ENCOUNTER — Other Ambulatory Visit: Payer: Self-pay

## 2017-03-07 DIAGNOSIS — E119 Type 2 diabetes mellitus without complications: Secondary | ICD-10-CM

## 2017-04-04 ENCOUNTER — Ambulatory Visit (INDEPENDENT_AMBULATORY_CARE_PROVIDER_SITE_OTHER): Payer: Medicare Other | Admitting: Family Medicine

## 2017-04-04 ENCOUNTER — Encounter: Payer: Self-pay | Admitting: Family Medicine

## 2017-04-04 VITALS — BP 126/80 | HR 62 | Temp 97.9°F | Resp 16 | Ht 66.0 in | Wt 180.2 lb

## 2017-04-04 DIAGNOSIS — E1142 Type 2 diabetes mellitus with diabetic polyneuropathy: Secondary | ICD-10-CM | POA: Diagnosis not present

## 2017-04-04 DIAGNOSIS — S90415A Abrasion, left lesser toe(s), initial encounter: Secondary | ICD-10-CM | POA: Diagnosis not present

## 2017-04-04 DIAGNOSIS — S91112A Laceration without foreign body of left great toe without damage to nail, initial encounter: Secondary | ICD-10-CM

## 2017-04-04 DIAGNOSIS — L03032 Cellulitis of left toe: Secondary | ICD-10-CM | POA: Diagnosis not present

## 2017-04-04 MED ORDER — SULFAMETHOXAZOLE-TRIMETHOPRIM 800-160 MG PO TABS: 1.0000 | ORAL_TABLET | Freq: Two times a day (BID) | ORAL | 0 refills | Status: DC

## 2017-04-04 MED ORDER — MUPIROCIN 2 % EX OINT: 1.0000 "application " | TOPICAL_OINTMENT | Freq: Two times a day (BID) | CUTANEOUS | 0 refills | Status: DC

## 2017-04-04 NOTE — Progress Notes (Signed)
Name: Sara Atkinson   MRN: [id number]    DOB: 14-Aug-1945   Date:04/04/2017       Progress Note  Subjective  Chief Complaint  Chief Complaint  Patient presents with  . Extremity Laceration    BIG toe left side    Laceration   The incident occurred more than 1 week ago. The laceration is located on the left foot. The laceration is 1 cm in size. The laceration mechanism was a metal edge. The pain is at a severity of 4/10. The pain is moderate. The pain has been constant since onset. Her tetanus status is UTD.    No problem-specific Assessment & Plan notes found for this encounter.   Past Medical History:  Diagnosis Date  . Anxiety   . 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 Diabetes mellitus without complication ([place])   . Hyperlipidemia   . Hypertension     Past Surgical History:  Procedure Laterality Date  . BREAST BIOPSY Left 2011   neg almost in cleavage  . COLONOSCOPY  10/17/2014   cleared for 3 years/ polyps- Dr Allen Norris  . CYSTOCELE REPAIR    . OOPHORECTOMY Bilateral 1971  . VAGINAL HYSTERECTOMY      Family History  Problem Relation Age of Onset  . Cancer Mother   . Diabetes Mother   . Diabetes Father   . Diabetes Maternal Grandfather   . Stroke Maternal Grandfather   . Diabetes Paternal Grandfather   . Breast cancer Neg Hx     Social History   Social History  . Marital status: Widowed    Spouse name: N/A  . Number of children: N/A  . Years of education: N/A   Occupational History  . Not on file.   Social History Main Topics  . Smoking status: Former Smoker    Quit date: 12/08/2011  . Smokeless tobacco: Never Used  . Alcohol use No  . Drug use: No  . Sexual activity: No   Other Topics Concern  . Not on file   Social History Narrative  . No narrative on file    Allergies  Allergen Reactions  . Metronidazole Anaphylaxis  . Penicillins Anaphylaxis and Hives  . Tetracyclines & Related Hives    Outpatient Medications Prior to Visit  Medication Sig  Dispense Refill  . albuterol (PROVENTIL HFA;VENTOLIN HFA) 108 (90 Base) MCG/ACT inhaler Inhale 2 puffs into the lungs every 6 (six) hours as needed for wheezing or shortness of breath. 1 Inhaler 0  . amLODipine (NORVASC) 10 MG tablet Take 1 tablet (10 mg total) by mouth daily. 90 tablet 1  . aspirin 81 MG tablet Take 81 mg by mouth daily.    Marland Kitchen BIOTIN 5000 PO Take 1 capsule by mouth daily.    . carvedilol (COREG) 25 MG tablet Take 1 tablet (25 mg total) by mouth 2 (two) times daily. 180 tablet 1  . Cholecalciferol (VITAMIN D3) 5000 UNITS CAPS Take 1 capsule by mouth daily.    Marland Kitchen GLIPIZIDE XL 5 MG 24 hr tablet TAKE 1 TABLET BY MOUTH WITH BREAKFAST 30 tablet 2  . glucose blood test strip One a day testing 100 each 1  . losartan (COZAAR) 50 MG tablet Take 1 tablet (50 mg total) by mouth daily. 90 tablet 1  . lovastatin (MEVACOR) 40 MG tablet Take 1 tablet (40 mg total) by mouth daily. 90 tablet 1  . metFORMIN (GLUCOPHAGE) 1000 MG tablet TAKE 1 TABLET BY MOUTH TWICE DAILY 180 tablet  0  . naproxen (NAPROSYN) 500 MG tablet Take 500 mg by mouth as needed. otc    . Omega-3 300 MG CAPS Take 1 capsule by mouth daily. 60 capsule 11   No facility-administered medications prior to visit.     Review of Systems  Constitutional: Negative for chills, fever, malaise/fatigue and weight loss.  HENT: Negative for ear discharge, ear pain and sore throat.   Eyes: Negative for blurred vision.  Respiratory: Negative for cough, sputum production, shortness of breath and wheezing.   Cardiovascular: Negative for chest pain, palpitations and leg swelling.  Gastrointestinal: Negative for abdominal pain, blood in stool, constipation, diarrhea, heartburn, melena and nausea.  Genitourinary: Negative for dysuria, frequency, hematuria and urgency.  Musculoskeletal: Negative for back pain, joint pain, myalgias and neck pain.  Skin: Negative for rash.  Neurological: Negative for dizziness, tingling, sensory change, focal  weakness and headaches.  Endo/Heme/Allergies: Negative for environmental allergies and polydipsia. Does not bruise/bleed easily.  Psychiatric/Behavioral: Negative for depression and suicidal ideas. The patient is not nervous/anxious and does not have insomnia.      Objective  Vitals:   04/04/17 1053  BP: 126/80  Pulse: 62  Resp: 16  Temp: 97.9 F (36.6 C)  TempSrc: Oral  SpO2: 94%  Weight: 180 lb 3.2 oz (81.7 kg)  Height: 5\' 6"  (1.676 m)    Physical Exam  Constitutional: She is well-developed, well-nourished, and in no distress. No distress.  HENT:  Head: Normocephalic and atraumatic.  Right Ear: External ear normal.  Left Ear: External ear normal.  Nose: Nose normal.  Mouth/Throat: Oropharynx is clear and moist.  Eyes: Pupils are equal, round, and reactive to light. Conjunctivae and EOM are normal. Right eye exhibits no discharge. Left eye exhibits no discharge.  Neck: Normal range of motion. Neck supple. No JVD present. No thyromegaly present.  Cardiovascular: Normal rate, regular rhythm, normal heart sounds and intact distal pulses.  Exam reveals no gallop and no friction rub.   No murmur heard. Pulmonary/Chest: Effort normal and breath sounds normal.  Abdominal: Soft. Bowel sounds are normal. She exhibits no mass. There is no tenderness. There is no guarding.  Musculoskeletal: Normal range of motion. She exhibits no edema.  Lymphadenopathy:    She has no cervical adenopathy.  Neurological: She is alert. She has normal reflexes.  Skin: Skin is warm and dry. She is not diaphoretic. There is erythema.  Laceration / medial aspect left great toe  Psychiatric: Mood and affect normal.  Nursing note and vitals reviewed.     Assessment & Plan  Problem List Items Addressed This Visit    None    Visit Diagnoses    Laceration of left great toe without damage to nail, foreign body presence unspecified, initial encounter    -  Primary   dressing applied   Relevant  Medications   sulfamethoxazole-trimethoprim (BACTRIM DS,SEPTRA DS) 800-160 MG tablet   mupirocin ointment (BACTROBAN) 2 %   Other Relevant Orders   Ambulatory referral to Podiatry   Abrasion of fourth toe of left foot, initial encounter          Meds ordered this encounter  Medications  . sulfamethoxazole-trimethoprim (BACTRIM DS,SEPTRA DS) 800-160 MG tablet    Sig: Take 1 tablet by mouth 2 (two) times daily.    Dispense:  20 tablet    Refill:  0  . mupirocin ointment (BACTROBAN) 2 %    Sig: Apply 1 application topically 2 (two) times daily.    Dispense:  22 g    Refill:  0      Dr. [facility]  04/04/17

## 2017-04-04 NOTE — Patient Instructions (Signed)

## 2017-04-18 DIAGNOSIS — E1142 Type 2 diabetes mellitus with diabetic polyneuropathy: Secondary | ICD-10-CM | POA: Diagnosis not present

## 2017-04-18 DIAGNOSIS — L03032 Cellulitis of left toe: Secondary | ICD-10-CM | POA: Diagnosis not present

## 2017-05-23 ENCOUNTER — Other Ambulatory Visit: Payer: Self-pay

## 2017-06-07 ENCOUNTER — Other Ambulatory Visit: Payer: Self-pay | Admitting: Family Medicine

## 2017-06-07 DIAGNOSIS — E119 Type 2 diabetes mellitus without complications: Secondary | ICD-10-CM

## 2017-06-24 ENCOUNTER — Other Ambulatory Visit: Payer: Self-pay | Admitting: Family Medicine

## 2017-06-24 DIAGNOSIS — Z1231 Encounter for screening mammogram for malignant neoplasm of breast: Secondary | ICD-10-CM

## 2017-07-05 ENCOUNTER — Other Ambulatory Visit: Payer: Self-pay | Admitting: Family Medicine

## 2017-07-05 DIAGNOSIS — E7849 Other hyperlipidemia: Secondary | ICD-10-CM

## 2017-07-10 ENCOUNTER — Encounter: Payer: Self-pay | Admitting: Family Medicine

## 2017-07-13 ENCOUNTER — Ambulatory Visit
Admission: RE | Admit: 2017-07-13 | Discharge: 2017-07-13 | Disposition: A | Discharge status: Home / Self Care | Payer: Medicare Other | Source: Ambulatory Visit | Attending: Family Medicine | Admitting: Family Medicine

## 2017-07-13 DIAGNOSIS — Z1231 Encounter for screening mammogram for malignant neoplasm of breast: Secondary | ICD-10-CM | POA: Diagnosis not present

## 2017-07-20 DIAGNOSIS — H02886 Meibomian gland dysfunction of left eye, unspecified eyelid: Secondary | ICD-10-CM | POA: Diagnosis not present

## 2017-07-20 DIAGNOSIS — H40003 Preglaucoma, unspecified, bilateral: Secondary | ICD-10-CM | POA: Diagnosis not present

## 2017-07-20 DIAGNOSIS — H02883 Meibomian gland dysfunction of right eye, unspecified eyelid: Secondary | ICD-10-CM | POA: Diagnosis not present

## 2017-07-20 DIAGNOSIS — H04123 Dry eye syndrome of bilateral lacrimal glands: Secondary | ICD-10-CM | POA: Diagnosis not present

## 2017-07-20 DIAGNOSIS — Z961 Presence of intraocular lens: Secondary | ICD-10-CM | POA: Diagnosis not present

## 2017-08-05 ENCOUNTER — Encounter: Payer: Self-pay | Admitting: Family Medicine

## 2017-08-05 ENCOUNTER — Ambulatory Visit (INDEPENDENT_AMBULATORY_CARE_PROVIDER_SITE_OTHER): Payer: Medicare Other | Admitting: Family Medicine

## 2017-08-05 ENCOUNTER — Other Ambulatory Visit
Admission: RE | Admit: 2017-08-05 | Discharge: 2017-08-05 | Disposition: A | Discharge status: Home / Self Care | Payer: Medicare Other | Source: Ambulatory Visit | Attending: Family Medicine | Admitting: Family Medicine

## 2017-08-05 VITALS — BP 124/80 | HR 80 | Ht 66.0 in | Wt 183.0 lb

## 2017-08-05 DIAGNOSIS — E119 Type 2 diabetes mellitus without complications: Secondary | ICD-10-CM

## 2017-08-05 DIAGNOSIS — G4700 Insomnia, unspecified: Secondary | ICD-10-CM | POA: Diagnosis not present

## 2017-08-05 DIAGNOSIS — I10 Essential (primary) hypertension: Secondary | ICD-10-CM

## 2017-08-05 DIAGNOSIS — J4520 Mild intermittent asthma, uncomplicated: Secondary | ICD-10-CM | POA: Diagnosis not present

## 2017-08-05 DIAGNOSIS — E7849 Other hyperlipidemia: Secondary | ICD-10-CM

## 2017-08-05 DIAGNOSIS — J40 Bronchitis, not specified as acute or chronic: Secondary | ICD-10-CM | POA: Diagnosis not present

## 2017-08-05 DIAGNOSIS — E782 Mixed hyperlipidemia: Secondary | ICD-10-CM | POA: Insufficient documentation

## 2017-08-05 LAB — RENAL FUNCTION PANEL
ANION GAP: 13 (ref 5–15)
Albumin: 4.2 g/dL (ref 3.5–5.0)
BUN: 14 mg/dL (ref 6–20)
CHLORIDE: 100 mmol/L — AB (ref 101–111)
CO2: 26 mmol/L (ref 22–32)
CREATININE: 0.67 mg/dL (ref 0.44–1.00)
Calcium: 8.9 mg/dL (ref 8.9–10.3)
Glucose, Bld: 151 mg/dL — ABNORMAL HIGH (ref 65–99)
POTASSIUM: 4.6 mmol/L (ref 3.5–5.1)
Phosphorus: 3.9 mg/dL (ref 2.5–4.6)
Sodium: 139 mmol/L (ref 135–145)

## 2017-08-05 LAB — LIPID PANEL
CHOLESTEROL: 175 mg/dL (ref 0–200)
HDL: 52 mg/dL (ref >40)
LDL Cholesterol: 100 mg/dL — ABNORMAL HIGH (ref 0–99)
TRIGLYCERIDES: 116 mg/dL (ref <150)
Total CHOL/HDL Ratio: 3.4 RATIO
VLDL: 23 mg/dL (ref 0–40)

## 2017-08-05 MED ORDER — AMLODIPINE BESYLATE 10 MG PO TABS: 10.0000 mg | ORAL_TABLET | Freq: Every day | ORAL | 1 refills | Status: AC

## 2017-08-05 MED ORDER — GLIPIZIDE ER 5 MG PO TB24: ORAL_TABLET | ORAL | 1 refills | Status: DC

## 2017-08-05 MED ORDER — TRAZODONE HCL 50 MG PO TABS: 25.0000 mg | ORAL_TABLET | Freq: Every evening | ORAL | 5 refills | Status: DC | PRN

## 2017-08-05 MED ORDER — CARVEDILOL 25 MG PO TABS: 25.0000 mg | ORAL_TABLET | Freq: Two times a day (BID) | ORAL | 1 refills | Status: AC

## 2017-08-05 MED ORDER — METFORMIN HCL 1000 MG PO TABS: 1000.0000 mg | ORAL_TABLET | Freq: Two times a day (BID) | ORAL | 1 refills | Status: AC

## 2017-08-05 MED ORDER — ALBUTEROL SULFATE HFA 108 (90 BASE) MCG/ACT IN AERS: 2.0000 | INHALATION_SPRAY | Freq: Four times a day (QID) | RESPIRATORY_TRACT | 11 refills | Status: AC | PRN

## 2017-08-05 MED ORDER — LOSARTAN POTASSIUM 50 MG PO TABS: 50.0000 mg | ORAL_TABLET | Freq: Every day | ORAL | 1 refills | Status: DC

## 2017-08-05 MED ORDER — OMEGA-3 300 MG PO CAPS: 1.0000 | ORAL_CAPSULE | Freq: Every day | ORAL | 11 refills | Status: DC

## 2017-08-05 MED ORDER — LOVASTATIN 40 MG PO TABS: 40.0000 mg | ORAL_TABLET | Freq: Every day | ORAL | 1 refills | Status: AC

## 2017-08-05 NOTE — Progress Notes (Signed)
Name: Sara Atkinson   MRN: [id number]    DOB: [birth year]   Date:08/05/2017       Progress Note  Subjective  Chief Complaint  Chief Complaint  Patient presents with  . Diabetes  . Hyperlipidemia  . Hypertension  . Anxiety    Diabetes  She presents for her follow-up diabetic visit. She has type 2 diabetes mellitus. Her disease course has been stable. Hypoglycemia symptoms include nervousness/anxiousness. Pertinent negatives for hypoglycemia include no confusion, dizziness or headaches. Pertinent negatives for diabetes include no blurred vision, no chest pain, no fatigue, no foot paresthesias, no foot ulcerations, no polydipsia, no polyphagia, no polyuria, no visual change, no weakness and no weight loss. There are no hypoglycemic complications. Symptoms are stable. There are no diabetic complications. Pertinent negatives for diabetic complications include no CVA, impotence, PVD or retinopathy. Risk factors for coronary artery disease include diabetes mellitus and dyslipidemia. Current diabetic treatment includes oral agent (dual therapy). She is compliant with treatment all of the time. Her weight is stable. She is following a generally healthy diet. Meal planning includes avoidance of concentrated sweets. She participates in exercise intermittently. Her home blood glucose trend is fluctuating minimally. Her breakfast blood glucose is taken between 8-9 am. Her breakfast blood glucose range is generally 130-140 mg/dl. An ACE inhibitor/angiotensin II receptor blocker is being taken. She does not see a podiatrist.Eye exam is current.  Hyperlipidemia  This is a chronic problem. The current episode started more than 1 year ago. The problem is controlled. Recent lipid tests were reviewed and are normal. Exacerbating diseases include diabetes and obesity. She has no history of chronic renal disease, hypothyroidism, liver disease or nephrotic syndrome. There are no known factors aggravating her  hyperlipidemia. Pertinent negatives include no chest pain, focal sensory loss, focal weakness, leg pain, myalgias or shortness of breath. Current antihyperlipidemic treatment includes statins. The current treatment provides moderate improvement of lipids. There are no compliance problems.  Risk factors for coronary artery disease include dyslipidemia, hypertension, post-menopausal and obesity.  Hypertension  This is a new problem. The current episode started more than 1 year ago. The problem is unchanged. Associated symptoms include anxiety. Pertinent negatives include no blurred vision, chest pain, headaches, malaise/fatigue, neck pain, palpitations or shortness of breath. Risk factors for coronary artery disease include dyslipidemia, obesity and post-menopausal state. Past treatments include beta blockers, calcium channel blockers and angiotensin blockers. There are no compliance problems.  There is no history of angina, kidney disease, CAD/MI, CVA, heart failure, left ventricular hypertrophy, PVD or retinopathy. There is no history of chronic renal disease.  Anxiety  Presents for follow-up visit. Symptoms include nervous/anxious behavior. Patient reports no chest pain, compulsions, confusion, decreased concentration, depressed mood, dizziness, dry mouth, excessive worry, feeling of choking, hyperventilation, impotence, insomnia, irritability, malaise, muscle tension, nausea, obsessions, palpitations, panic, restlessness, shortness of breath or suicidal ideas. Symptoms occur constantly. The severity of symptoms is moderate. The quality of sleep is good.      No problem-specific Assessment & Plan notes found for this encounter.   Past Medical History:  Diagnosis Date  . Anxiety   . Cancer ([place]) 1971   uterine  . Diabetes mellitus without complication ([place])   . Hyperlipidemia   . Hypertension     Past Surgical History:  Procedure Laterality Date  . BREAST BIOPSY Left 2011   neg almost in  cleavage  . COLONOSCOPY  10/17/2014   cleared for 3 years/ polyps- Dr Allen Norris  . CYSTOCELE REPAIR    .  OOPHORECTOMY Bilateral 1971  . VAGINAL HYSTERECTOMY      Family History  Problem Relation Age of Onset  . Cancer Mother   . Diabetes Mother   . Diabetes Father   . Diabetes Maternal Grandfather   . Stroke Maternal Grandfather   . Diabetes Paternal Grandfather   . Breast cancer Neg Hx     Social History   Socioeconomic History  . Marital status: Widowed    Spouse name: Not on file  . Number of children: Not on file  . Years of education: Not on file  . Highest education level: Not on file  Social Needs  . Financial resource strain: Not on file  . Food insecurity - worry: Not on file  . Food insecurity - inability: Not on file  . Transportation needs - medical: Not on file  . Transportation needs - non-medical: Not on file  Occupational History  . Not on file  Tobacco Use  . Smoking status: Former Smoker    Last attempt to quit: 12/08/2011    Years since quitting: 5.6  . Smokeless tobacco: Never Used  Substance and Sexual Activity  . Alcohol use: No    Alcohol/week: 0.0 oz  . Drug use: No  . Sexual activity: No  Other Topics Concern  . Not on file  Social History Narrative  . Not on file    Allergies  Allergen Reactions  . Metronidazole Anaphylaxis  . Penicillins Anaphylaxis and Hives  . Tetracyclines & Related Hives    Outpatient Medications Prior to Visit  Medication Sig Dispense Refill  . aspirin 81 MG tablet Take 81 mg by mouth daily.    Marland Kitchen BIOTIN 5000 PO Take 1 capsule by mouth daily.    . Cholecalciferol (VITAMIN D3) 5000 UNITS CAPS Take 1 capsule by mouth daily.    Marland Kitchen glucose blood test strip One a day testing 100 each 1  . LORazepam (ATIVAN) 0.5 MG tablet Take 0.5 mg by mouth as needed for anxiety.    Marland Kitchen albuterol (PROVENTIL HFA;VENTOLIN HFA) 108 (90 Base) MCG/ACT inhaler Inhale 2 puffs into the lungs every 6 (six) hours as needed for wheezing or  shortness of breath. 1 Inhaler 0  . amLODipine (NORVASC) 10 MG tablet Take 1 tablet (10 mg total) by mouth daily. 90 tablet 1  . carvedilol (COREG) 25 MG tablet Take 1 tablet (25 mg total) by mouth 2 (two) times daily. 180 tablet 1  . GLIPIZIDE XL 5 MG 24 hr tablet TAKE 1 TABLET BY MOUTH WITH BREAKFAST 30 tablet 2  . losartan (COZAAR) 50 MG tablet Take 1 tablet (50 mg total) by mouth daily. 90 tablet 1  . lovastatin (MEVACOR) 40 MG tablet TAKE ONE TABLET BY MOUTH ONCE DAILY 30 tablet 0  . metFORMIN (GLUCOPHAGE) 1000 MG tablet TAKE 1 TABLET BY MOUTH TWICE DAILY 180 tablet 0  . Omega-3 300 MG CAPS Take 1 capsule by mouth daily. 60 capsule 11  . mupirocin ointment (BACTROBAN) 2 % Apply 1 application topically 2 (two) times daily. (Patient not taking: Reported on 08/05/2017) 22 g 0  . naproxen (NAPROSYN) 500 MG tablet Take 500 mg by mouth as needed. otc    . sulfamethoxazole-trimethoprim (BACTRIM DS,SEPTRA DS) 800-160 MG tablet Take 1 tablet by mouth 2 (two) times daily. 20 tablet 0   No facility-administered medications prior to visit.     Review of Systems  Constitutional: Negative for chills, fatigue, fever, irritability, malaise/fatigue and weight loss.  HENT: Negative for ear  discharge, ear pain and sore throat.   Eyes: Negative for blurred vision.  Respiratory: Negative for cough, sputum production, shortness of breath and wheezing.   Cardiovascular: Negative for chest pain, palpitations and leg swelling.  Gastrointestinal: Negative for abdominal pain, blood in stool, constipation, diarrhea, heartburn, melena and nausea.  Genitourinary: Negative for dysuria, frequency, hematuria, impotence and urgency.  Musculoskeletal: Negative for back pain, joint pain, myalgias and neck pain.  Skin: Negative for rash.  Neurological: Negative for dizziness, tingling, sensory change, focal weakness, weakness and headaches.  Endo/Heme/Allergies: Negative for environmental allergies, polydipsia and  polyphagia. Does not bruise/bleed easily.  Psychiatric/Behavioral: Negative for confusion, decreased concentration, depression and suicidal ideas. The patient is nervous/anxious. The patient does not have insomnia.      Objective  Vitals:   08/05/17 0935  BP: 124/80  Pulse: 80  Weight: 183 lb (83 kg)  Height: 5\' 6"  (1.676 m)    Physical Exam  Constitutional: She is well-developed, well-nourished, and in no distress. No distress.  HENT:  Head: Normocephalic and atraumatic.  Right Ear: External ear normal.  Left Ear: External ear normal.  Nose: Nose normal.  Mouth/Throat: Oropharynx is clear and moist.  Eyes: Conjunctivae and EOM are normal. Pupils are equal, round, and reactive to light. Right eye exhibits no discharge. Left eye exhibits no discharge.  Neck: Normal range of motion. Neck supple. No JVD present. No thyromegaly present.  Cardiovascular: Normal rate, regular rhythm, normal heart sounds and intact distal pulses. Exam reveals no gallop and no friction rub.  No murmur heard. Pulmonary/Chest: Effort normal and breath sounds normal. She has no wheezes. She has no rales.  Abdominal: Soft. Bowel sounds are normal. She exhibits no mass. There is no tenderness. There is no rebound and no guarding.  Musculoskeletal: Normal range of motion. She exhibits no edema.  Lymphadenopathy:    She has no cervical adenopathy.  Neurological: She is alert.  Skin: Skin is warm and dry. She is not diaphoretic.  Psychiatric: Mood and affect normal.  Nursing note and vitals reviewed.     Assessment & Plan  Problem List Items Addressed This Visit      Endocrine   Type 2 diabetes mellitus without complication, without long-term current use of insulin (HCC)   Relevant Medications   losartan (COZAAR) 50 MG tablet   metFORMIN (GLUCOPHAGE) 1000 MG tablet   glipiZIDE (GLIPIZIDE XL) 5 MG 24 hr tablet   lovastatin (MEVACOR) 40 MG tablet   Other Relevant Orders   Renal Function Panel    Hemoglobin A1c    Other Visit Diagnoses    Insomnia, unspecified type    -  Primary   Relevant Medications   traZODone (DESYREL) 50 MG tablet   Bronchitis       Relevant Medications   albuterol (PROVENTIL HFA;VENTOLIN HFA) 108 (90 Base) MCG/ACT inhaler   Mild intermittent reactive airway disease without complication       Relevant Medications   albuterol (PROVENTIL HFA;VENTOLIN HFA) 108 (90 Base) MCG/ACT inhaler   Essential hypertension       Relevant Medications   amLODipine (NORVASC) 10 MG tablet   carvedilol (COREG) 25 MG tablet   losartan (COZAAR) 50 MG tablet   lovastatin (MEVACOR) 40 MG tablet   Other Relevant Orders   Renal Function Panel   Hyperlipidemia       Relevant Medications   amLODipine (NORVASC) 10 MG tablet   carvedilol (COREG) 25 MG tablet   losartan (COZAAR) 50 MG tablet  Omega-3 300 MG CAPS   lovastatin (MEVACOR) 40 MG tablet   Other Relevant Orders   Lipid panel   Other hyperlipidemia       Relevant Medications   amLODipine (NORVASC) 10 MG tablet   carvedilol (COREG) 25 MG tablet   losartan (COZAAR) 50 MG tablet   lovastatin (MEVACOR) 40 MG tablet      Meds ordered this encounter  Medications  . albuterol (PROVENTIL HFA;VENTOLIN HFA) 108 (90 Base) MCG/ACT inhaler    Sig: Inhale 2 puffs into the lungs every 6 (six) hours as needed for wheezing or shortness of breath.    Dispense:  1 Inhaler    Refill:  11  . amLODipine (NORVASC) 10 MG tablet    Sig: Take 1 tablet (10 mg total) by mouth daily.    Dispense:  90 tablet    Refill:  1  . carvedilol (COREG) 25 MG tablet    Sig: Take 1 tablet (25 mg total) by mouth 2 (two) times daily.    Dispense:  180 tablet    Refill:  1  . losartan (COZAAR) 50 MG tablet    Sig: Take 1 tablet (50 mg total) by mouth daily.    Dispense:  90 tablet    Refill:  1  . Omega-3 300 MG CAPS    Sig: Take 1 capsule by mouth daily.    Dispense:  60 capsule    Refill:  11  . metFORMIN (GLUCOPHAGE) 1000 MG tablet     Sig: Take 1 tablet (1,000 mg total) by mouth 2 (two) times daily.    Dispense:  180 tablet    Refill:  1    Needs appt for Dec  . glipiZIDE (GLIPIZIDE XL) 5 MG 24 hr tablet    Sig: TAKE 1 TABLET BY MOUTH WITH BREAKFAST    Dispense:  90 tablet    Refill:  1    Please consider 90 day supplies to promote better adherence  . lovastatin (MEVACOR) 40 MG tablet    Sig: Take 1 tablet (40 mg total) by mouth daily.    Dispense:  90 tablet    Refill:  1  . traZODone (DESYREL) 50 MG tablet    Sig: Take 0.5-1 tablets (25-50 mg total) by mouth at bedtime as needed for sleep.    Dispense:  30 tablet    Refill:  5      Dr. Otilio Miu [facility]  08/05/17

## 2017-08-06 LAB — HEMOGLOBIN A1C
Hgb A1c MFr Bld: 6.8 % — ABNORMAL HIGH (ref 4.8–5.6)
MEAN PLASMA GLUCOSE: 148.46 mg/dL

## 2017-08-23 DIAGNOSIS — I10 Essential (primary) hypertension: Secondary | ICD-10-CM | POA: Diagnosis not present

## 2017-08-23 DIAGNOSIS — E7800 Pure hypercholesterolemia, unspecified: Secondary | ICD-10-CM | POA: Diagnosis not present

## 2017-08-23 DIAGNOSIS — N3946 Mixed incontinence: Secondary | ICD-10-CM | POA: Insufficient documentation

## 2017-08-23 DIAGNOSIS — E119 Type 2 diabetes mellitus without complications: Secondary | ICD-10-CM | POA: Diagnosis not present

## 2017-11-04 DIAGNOSIS — M65331 Trigger finger, right middle finger: Secondary | ICD-10-CM | POA: Diagnosis not present

## 2017-11-24 DIAGNOSIS — E119 Type 2 diabetes mellitus without complications: Secondary | ICD-10-CM | POA: Diagnosis not present

## 2017-11-24 DIAGNOSIS — E7800 Pure hypercholesterolemia, unspecified: Secondary | ICD-10-CM | POA: Diagnosis not present

## 2017-11-24 DIAGNOSIS — I10 Essential (primary) hypertension: Secondary | ICD-10-CM | POA: Diagnosis not present

## 2017-11-24 DIAGNOSIS — G63 Polyneuropathy in diseases classified elsewhere: Secondary | ICD-10-CM | POA: Diagnosis not present

## 2018-01-06 ENCOUNTER — Telehealth: Payer: Self-pay | Admitting: Family Medicine

## 2018-01-06 NOTE — Telephone Encounter (Signed)
Pt will be seeing Dr. Janene Harvey at [facility]

## 2018-01-09 IMAGING — MG MM DIGITAL SCREENING BILAT W/ TOMO W/ CAD
8 of 12 series · 8 of 28 positions shown · non-contrast
Comparison: Previous exam(s).

ACR Breast Density Category a: The breast tissue is almost entirely
fatty.

CLINICAL DATA: Screening.

EXAM:
2D DIGITAL SCREENING BILATERAL MAMMOGRAM WITH CAD AND ADJUNCT TOMO

[R CC synth-2D]
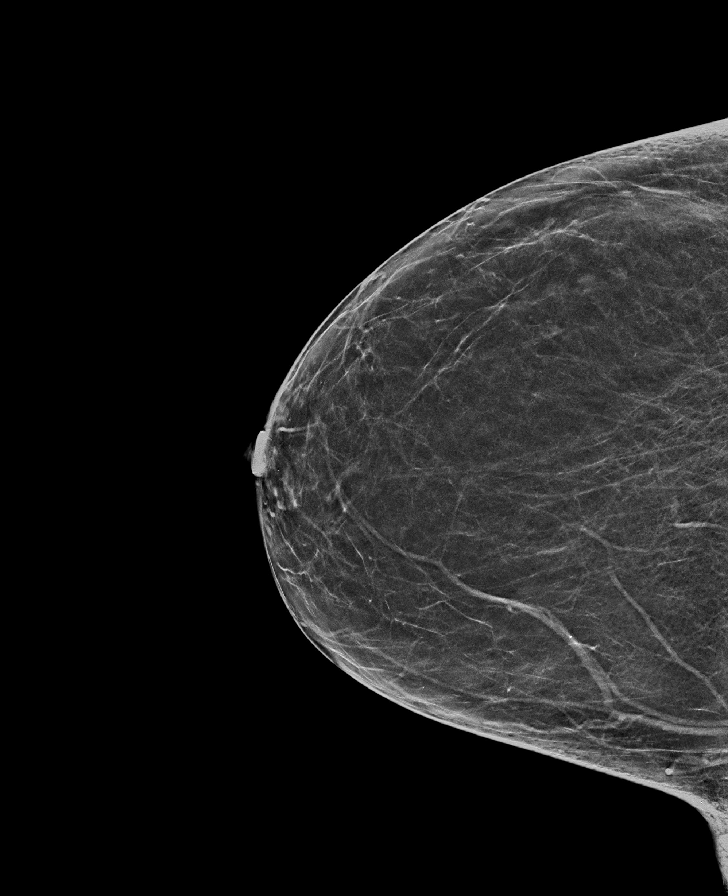

[L CC synth-2D]
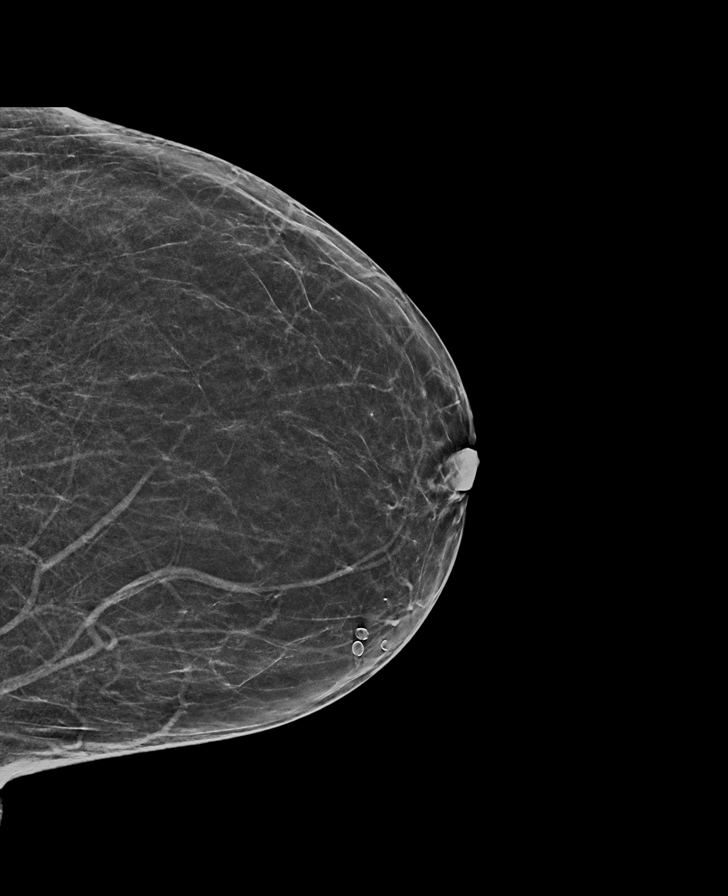

[R MLO synth-2D]
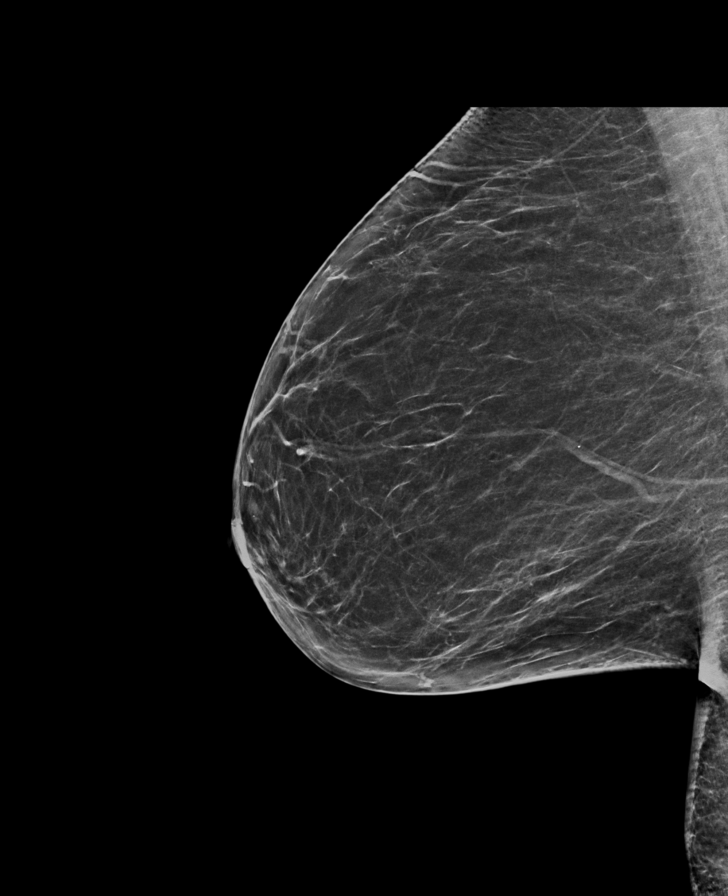

[L MLO synth-2D]
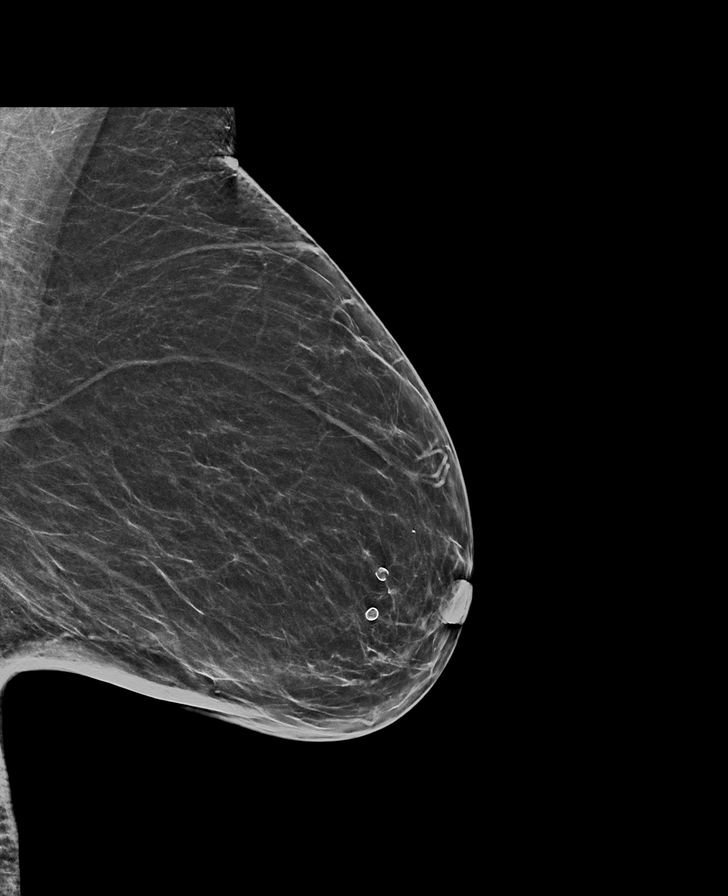

[L CC]
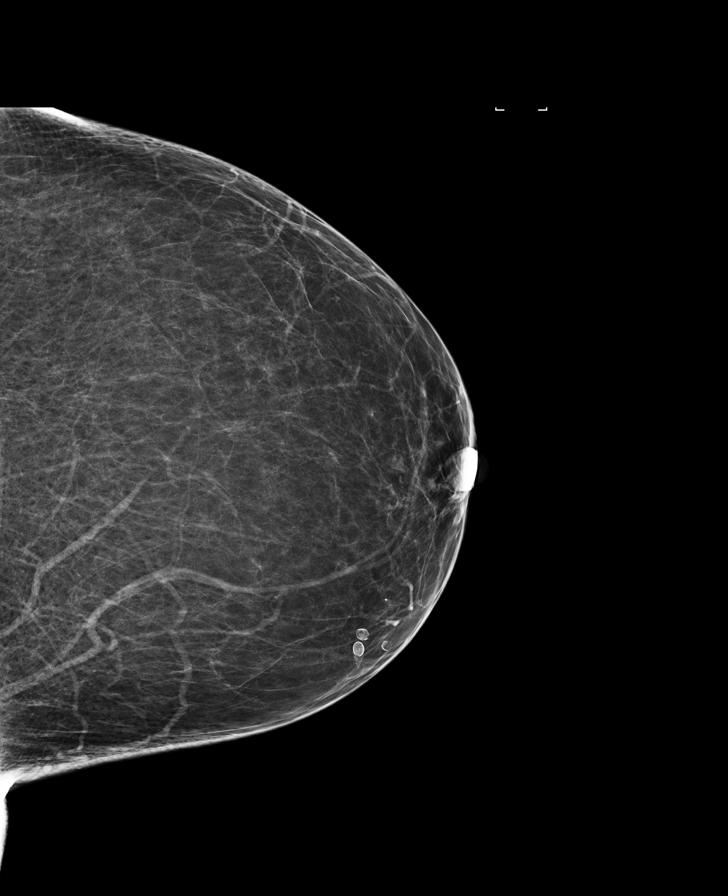

[L MLO]
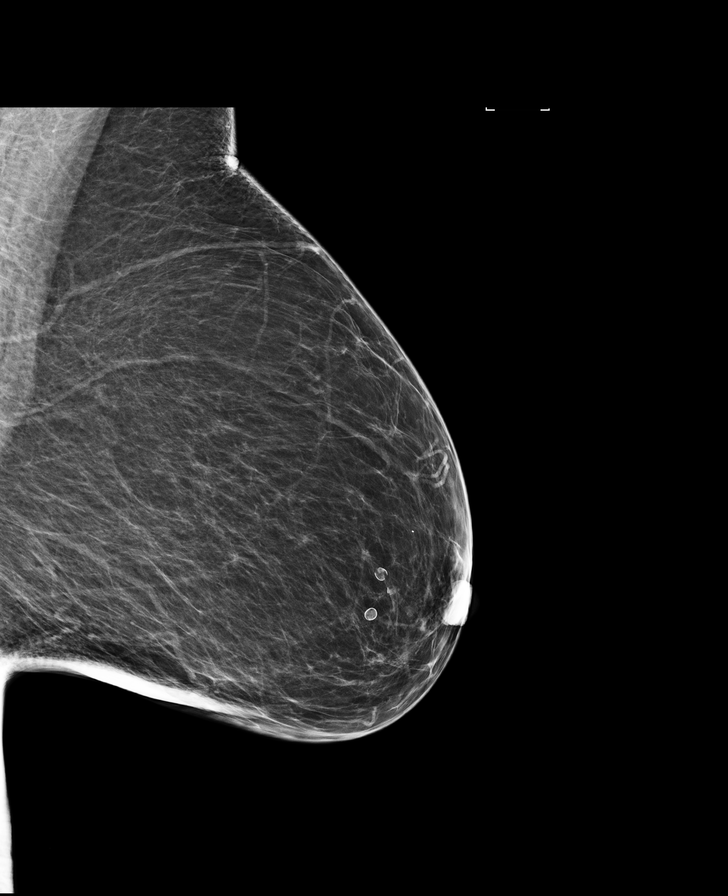

[R CC]
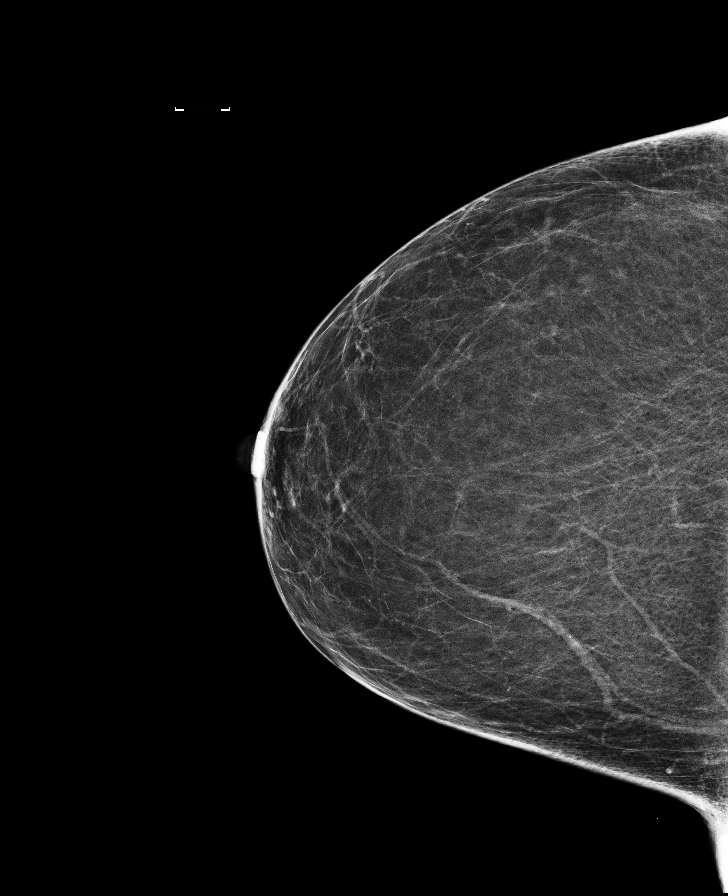

[R MLO]
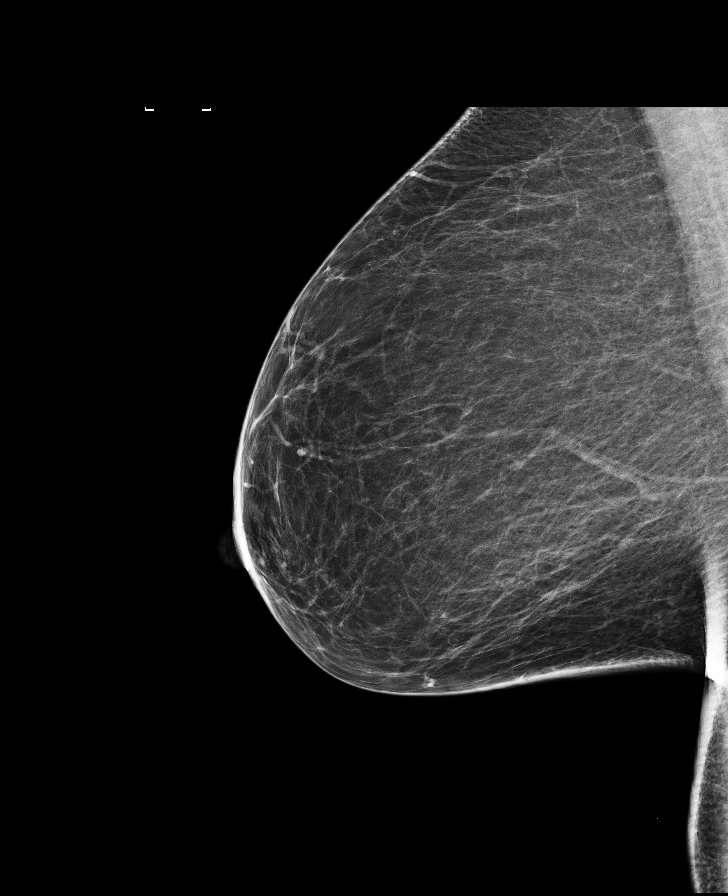

[8 of 28 positions shown; findings below may reference images not displayed]

FINDINGS: There are no findings suspicious for malignancy. Images were
processed with CAD.
IMPRESSION: No mammographic evidence of malignancy. A result letter of this
screening mammogram will be mailed directly to the patient.

RECOMMENDATION:
Screening mammogram in one year. (Code:1B-X-8P0)

BI-RADS CATEGORY  1: Negative.

## 2018-02-03 ENCOUNTER — Ambulatory Visit: Payer: Medicare Other | Admitting: Family Medicine

## 2018-03-13 DIAGNOSIS — E7800 Pure hypercholesterolemia, unspecified: Secondary | ICD-10-CM | POA: Diagnosis not present

## 2018-03-13 DIAGNOSIS — I10 Essential (primary) hypertension: Secondary | ICD-10-CM | POA: Diagnosis not present

## 2018-03-13 DIAGNOSIS — D696 Thrombocytopenia, unspecified: Secondary | ICD-10-CM | POA: Diagnosis not present

## 2018-03-13 DIAGNOSIS — E538 Deficiency of other specified B group vitamins: Secondary | ICD-10-CM | POA: Diagnosis not present

## 2018-03-13 DIAGNOSIS — Z0000 Encounter for general adult medical examination without abnormal findings: Secondary | ICD-10-CM | POA: Diagnosis not present

## 2018-03-13 DIAGNOSIS — E119 Type 2 diabetes mellitus without complications: Secondary | ICD-10-CM | POA: Diagnosis not present

## 2018-03-22 ENCOUNTER — Other Ambulatory Visit: Payer: Self-pay | Admitting: Family Medicine

## 2018-03-22 DIAGNOSIS — E119 Type 2 diabetes mellitus without complications: Secondary | ICD-10-CM

## 2018-03-24 ENCOUNTER — Other Ambulatory Visit: Payer: Self-pay | Admitting: Family Medicine

## 2018-03-24 DIAGNOSIS — I10 Essential (primary) hypertension: Secondary | ICD-10-CM

## 2018-05-02 ENCOUNTER — Other Ambulatory Visit: Payer: Self-pay | Admitting: Family Medicine

## 2018-05-02 DIAGNOSIS — I10 Essential (primary) hypertension: Secondary | ICD-10-CM

## 2018-05-02 DIAGNOSIS — E7849 Other hyperlipidemia: Secondary | ICD-10-CM

## 2018-05-02 DIAGNOSIS — M65331 Trigger finger, right middle finger: Secondary | ICD-10-CM | POA: Diagnosis not present

## 2018-05-11 ENCOUNTER — Other Ambulatory Visit: Payer: Self-pay | Admitting: Family Medicine

## 2018-05-11 DIAGNOSIS — I10 Essential (primary) hypertension: Secondary | ICD-10-CM

## 2018-06-23 ENCOUNTER — Other Ambulatory Visit: Payer: Self-pay | Admitting: Family Medicine

## 2018-06-23 DIAGNOSIS — I10 Essential (primary) hypertension: Secondary | ICD-10-CM

## 2018-07-18 DIAGNOSIS — N3000 Acute cystitis without hematuria: Secondary | ICD-10-CM | POA: Insufficient documentation

## 2018-07-20 MED ORDER — MENTHOL 9.1 MG MT LOZG
1.00 | LOZENGE | OROMUCOSAL | Status: DC
Start: ? — End: 2018-07-20

## 2018-07-20 MED ORDER — AZITHROMYCIN 250 MG PO TABS
250.00 | ORAL_TABLET | ORAL | Status: DC
Start: 2018-07-21 — End: 2018-07-20

## 2018-07-20 MED ORDER — IPRATROPIUM BROMIDE 0.02 % IN SOLN
500.00 | RESPIRATORY_TRACT | Status: DC
Start: ? — End: 2018-07-20

## 2018-07-20 MED ORDER — GUAIFENESIN 400 MG PO TABS
400.00 | ORAL_TABLET | ORAL | Status: DC
Start: ? — End: 2018-07-20

## 2018-07-20 MED ORDER — ALBUTEROL SULFATE HFA 108 (90 BASE) MCG/ACT IN AERS
2.00 | INHALATION_SPRAY | RESPIRATORY_TRACT | Status: DC
Start: ? — End: 2018-07-20

## 2018-07-20 MED ORDER — PRAVASTATIN SODIUM 40 MG PO TABS
40.00 | ORAL_TABLET | ORAL | Status: DC
Start: 2018-07-20 — End: 2018-07-20

## 2018-07-20 MED ORDER — KETOTIFEN FUMARATE 0.025 % OP SOLN
1.00 | OPHTHALMIC | Status: DC
Start: 2018-07-20 — End: 2018-07-20

## 2018-07-20 MED ORDER — ALBUTEROL SULFATE (2.5 MG/3ML) 0.083% IN NEBU
2.50 | INHALATION_SOLUTION | RESPIRATORY_TRACT | Status: DC
Start: ? — End: 2018-07-20

## 2018-07-20 MED ORDER — LOSARTAN POTASSIUM 25 MG PO TABS
50.00 | ORAL_TABLET | ORAL | Status: DC
Start: 2018-07-21 — End: 2018-07-20

## 2018-07-20 MED ORDER — CARBOXYMETHYLCELLULOSE SODIUM 0.25 % OP SOLN
2.00 | OPHTHALMIC | Status: DC
Start: 2018-07-20 — End: 2018-07-20

## 2018-07-20 MED ORDER — ASPIRIN 81 MG PO CHEW
81.00 | CHEWABLE_TABLET | ORAL | Status: DC
Start: 2018-07-21 — End: 2018-07-20

## 2018-07-20 MED ORDER — ACETAMINOPHEN 325 MG PO TABS
650.00 | ORAL_TABLET | ORAL | Status: DC
Start: ? — End: 2018-07-20

## 2018-07-20 MED ORDER — AMLODIPINE BESYLATE 10 MG PO TABS
10.00 | ORAL_TABLET | ORAL | Status: DC
Start: 2018-07-21 — End: 2018-07-20

## 2018-07-20 MED ORDER — PREDNISONE 20 MG PO TABS
40.00 | ORAL_TABLET | ORAL | Status: DC
Start: 2018-07-21 — End: 2018-07-20

## 2018-07-20 MED ORDER — VITAMIN B-12 1000 MCG PO TABS
1000.00 | ORAL_TABLET | ORAL | Status: DC
Start: 2018-07-21 — End: 2018-07-20

## 2018-07-20 MED ORDER — CHOLECALCIFEROL 25 MCG (1000 UT) PO TABS
5000.00 | ORAL_TABLET | ORAL | Status: DC
Start: 2018-07-21 — End: 2018-07-20

## 2018-07-20 MED ORDER — CARVEDILOL 12.5 MG PO TABS
25.00 | ORAL_TABLET | ORAL | Status: DC
Start: 2018-07-20 — End: 2018-07-20

## 2018-07-20 MED ORDER — METFORMIN HCL 500 MG PO TABS
1000.00 | ORAL_TABLET | ORAL | Status: DC
Start: 2018-07-20 — End: 2018-07-20

## 2018-08-22 DIAGNOSIS — D696 Thrombocytopenia, unspecified: Secondary | ICD-10-CM | POA: Insufficient documentation

## 2018-09-05 ENCOUNTER — Other Ambulatory Visit: Payer: Self-pay | Admitting: Pediatrics

## 2018-09-05 DIAGNOSIS — Z1231 Encounter for screening mammogram for malignant neoplasm of breast: Secondary | ICD-10-CM

## 2018-09-20 ENCOUNTER — Ambulatory Visit
Admission: RE | Admit: 2018-09-20 | Discharge: 2018-09-20 | Disposition: A | Discharge status: Home / Self Care | Payer: Medicare Other | Source: Ambulatory Visit | Attending: Pediatrics | Admitting: Pediatrics

## 2018-09-20 DIAGNOSIS — Z1231 Encounter for screening mammogram for malignant neoplasm of breast: Secondary | ICD-10-CM | POA: Insufficient documentation

## 2018-10-14 ENCOUNTER — Other Ambulatory Visit: Payer: Self-pay | Admitting: Family Medicine

## 2018-10-14 DIAGNOSIS — E119 Type 2 diabetes mellitus without complications: Secondary | ICD-10-CM

## 2018-10-19 ENCOUNTER — Other Ambulatory Visit: Payer: Self-pay | Admitting: Family Medicine

## 2018-10-19 DIAGNOSIS — E119 Type 2 diabetes mellitus without complications: Secondary | ICD-10-CM

## 2018-10-21 ENCOUNTER — Other Ambulatory Visit: Payer: Self-pay | Admitting: Family Medicine

## 2018-10-21 DIAGNOSIS — E119 Type 2 diabetes mellitus without complications: Secondary | ICD-10-CM

## 2018-10-24 ENCOUNTER — Other Ambulatory Visit: Payer: Self-pay | Admitting: Family Medicine

## 2018-10-24 DIAGNOSIS — E119 Type 2 diabetes mellitus without complications: Secondary | ICD-10-CM

## 2019-02-06 ENCOUNTER — Other Ambulatory Visit (HOSPITAL_COMMUNITY): Payer: Self-pay | Admitting: Pulmonary Disease

## 2019-02-06 ENCOUNTER — Other Ambulatory Visit: Payer: Self-pay | Admitting: Pulmonary Disease

## 2019-02-06 DIAGNOSIS — J328 Other chronic sinusitis: Secondary | ICD-10-CM

## 2019-02-06 DIAGNOSIS — J449 Chronic obstructive pulmonary disease, unspecified: Secondary | ICD-10-CM

## 2019-02-08 ENCOUNTER — Other Ambulatory Visit: Payer: Self-pay

## 2019-02-08 ENCOUNTER — Ambulatory Visit
Admission: RE | Admit: 2019-02-08 | Discharge: 2019-02-08 | Disposition: A | Discharge status: Home / Self Care | Payer: Medicare Other | Source: Ambulatory Visit | Attending: Pulmonary Disease | Admitting: Pulmonary Disease

## 2019-02-08 DIAGNOSIS — J328 Other chronic sinusitis: Secondary | ICD-10-CM | POA: Insufficient documentation

## 2019-02-08 DIAGNOSIS — J449 Chronic obstructive pulmonary disease, unspecified: Secondary | ICD-10-CM | POA: Diagnosis present

## 2019-03-02 DIAGNOSIS — E538 Deficiency of other specified B group vitamins: Secondary | ICD-10-CM | POA: Insufficient documentation

## 2019-08-15 DIAGNOSIS — K635 Polyp of colon: Secondary | ICD-10-CM | POA: Insufficient documentation

## 2019-09-03 ENCOUNTER — Other Ambulatory Visit: Payer: Self-pay | Admitting: Pediatrics

## 2019-09-03 DIAGNOSIS — Z1231 Encounter for screening mammogram for malignant neoplasm of breast: Secondary | ICD-10-CM

## 2019-09-25 ENCOUNTER — Ambulatory Visit
Admission: RE | Admit: 2019-09-25 | Discharge: 2019-09-25 | Disposition: A | Discharge status: Home / Self Care | Payer: Medicare Other | Source: Ambulatory Visit | Attending: Pediatrics | Admitting: Pediatrics

## 2019-09-25 ENCOUNTER — Other Ambulatory Visit: Payer: Self-pay

## 2019-09-25 DIAGNOSIS — Z1231 Encounter for screening mammogram for malignant neoplasm of breast: Secondary | ICD-10-CM

## 2020-04-09 DIAGNOSIS — M6530 Trigger finger, unspecified finger: Secondary | ICD-10-CM | POA: Insufficient documentation

## 2020-04-09 DIAGNOSIS — U071 COVID-19: Secondary | ICD-10-CM | POA: Insufficient documentation

## 2020-05-08 DIAGNOSIS — Z8616 Personal history of COVID-19: Secondary | ICD-10-CM | POA: Insufficient documentation

## 2020-07-15 ENCOUNTER — Ambulatory Visit
Admission: EM | Admit: 2020-07-15 | Discharge: 2020-07-15 | Disposition: A | Discharge status: Home / Self Care | Payer: Medicare Other | Attending: Family Medicine | Admitting: Family Medicine

## 2020-07-15 ENCOUNTER — Other Ambulatory Visit: Payer: Self-pay

## 2020-07-15 ENCOUNTER — Ambulatory Visit (INDEPENDENT_AMBULATORY_CARE_PROVIDER_SITE_OTHER): Payer: Medicare Other

## 2020-07-15 ENCOUNTER — Encounter: Payer: Self-pay | Admitting: Emergency Medicine

## 2020-07-15 DIAGNOSIS — M546 Pain in thoracic spine: Secondary | ICD-10-CM | POA: Diagnosis not present

## 2020-07-15 DIAGNOSIS — R079 Chest pain, unspecified: Secondary | ICD-10-CM | POA: Diagnosis not present

## 2020-07-15 DIAGNOSIS — R0789 Other chest pain: Secondary | ICD-10-CM | POA: Insufficient documentation

## 2020-07-15 LAB — COMPREHENSIVE METABOLIC PANEL
ALT: 21 U/L (ref 0–44)
AST: 31 U/L (ref 15–41)
Albumin: 4.5 g/dL (ref 3.5–5.0)
Alkaline Phosphatase: 67 U/L (ref 38–126)
Anion gap: 10 (ref 5–15)
BUN: 15 mg/dL (ref 8–23)
CO2: 27 mmol/L (ref 22–32)
Calcium: 9.5 mg/dL (ref 8.9–10.3)
Chloride: 98 mmol/L (ref 98–111)
Creatinine, Ser: 0.55 mg/dL (ref 0.44–1.00)
GFR, Estimated: >60 mL/min (ref >60)
Glucose, Bld: 240 mg/dL — ABNORMAL HIGH (ref 70–99)
Potassium: 5 mmol/L (ref 3.5–5.1)
Sodium: 135 mmol/L (ref 135–145)
Total Bilirubin: 0.8 mg/dL (ref 0.3–1.2)
Total Protein: 7.8 g/dL (ref 6.5–8.1)

## 2020-07-15 LAB — CBC WITH DIFFERENTIAL/PLATELET
Abs Immature Granulocytes: 0.02 10*3/uL (ref 0.00–0.07)
Basophils Absolute: 0.1 10*3/uL (ref 0.0–0.1)
Basophils Relative: 1 %
Eosinophils Absolute: 0.1 10*3/uL (ref 0.0–0.5)
Eosinophils Relative: 2 %
HCT: 37.3 % (ref 36.0–46.0)
Hemoglobin: 12.4 g/dL (ref 12.0–15.0)
Immature Granulocytes: 0 %
Lymphocytes Relative: 23 %
Lymphs Abs: 1.4 10*3/uL (ref 0.7–4.0)
MCH: 28.6 pg (ref 26.0–34.0)
MCHC: 33.2 g/dL (ref 30.0–36.0)
MCV: 86.1 fL (ref 80.0–100.0)
Monocytes Absolute: 0.5 10*3/uL (ref 0.1–1.0)
Monocytes Relative: 7 %
Neutro Abs: 4.1 10*3/uL (ref 1.7–7.7)
Neutrophils Relative %: 67 %
Platelets: 146 10*3/uL — ABNORMAL LOW (ref 150–400)
RBC: 4.33 MIL/uL (ref 3.87–5.11)
RDW: 13.8 % (ref 11.5–15.5)
WBC: 6.2 10*3/uL (ref 4.0–10.5)
nRBC: 0 % (ref 0.0–0.2)

## 2020-07-15 LAB — LIPASE, BLOOD: Lipase: 36 U/L (ref 11–51)

## 2020-07-15 LAB — TROPONIN I (HIGH SENSITIVITY): Troponin I (High Sensitivity): 4 ng/L (ref <18)

## 2020-07-15 MED ORDER — TRAMADOL HCL 50 MG PO TABS: 50.0000 mg | ORAL_TABLET | Freq: Three times a day (TID) | ORAL | 0 refills | Status: AC | PRN

## 2020-07-15 MED ORDER — PANTOPRAZOLE SODIUM 40 MG PO TBEC: 40.0000 mg | DELAYED_RELEASE_TABLET | Freq: Every day | ORAL | 0 refills | Status: DC

## 2020-07-15 NOTE — ED Provider Notes (Signed)
[facility]    CSN: [id number] Arrival date & time: 07/15/20  7048      History   Chief Complaint Chief Complaint  Patient presents with  . Chest Pain  . Back Pain  . Arm Pain    right   HPI  74 year old female presents with the above complaint.  Patient reports a 2-week history of right arm pain which affects the shoulder and down to the elbow.  This pain radiates to the middle of her chest.  She is also had thoracic back pain/pain between the shoulder blades.  She states that the pain comes and goes.  Is severe at times.  Seems to be getting worse as it has been persistent.  Has been particular worse today.  She is currently pain-free.  Patient is concerned about her symptoms.  She is worried that something more is going on.  She states that she has had some associated nausea.  Some shortness of breath.  No relieving factors.  Denies abdominal pain.  No other complaints or concerns at this time.  Past Medical History:  Diagnosis Date  . Anxiety   . 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 Diabetes mellitus without complication ([place])   . Hyperlipidemia   . Hypertension     Patient Active Problem List   Diagnosis Date Noted  . Chronic obstructive pulmonary disease ([place]) 12/14/2016  . Type 2 diabetes mellitus without complication, without long-term current use of insulin ([place]) 01/07/2016    Past Surgical History:  Procedure Laterality Date  . BREAST BIOPSY Left 2011   neg almost in cleavage  . COLONOSCOPY  10/17/2014   cleared for 3 years/ polyps- Dr Allen Norris  . CYSTOCELE REPAIR    . OOPHORECTOMY Bilateral 1971  . VAGINAL HYSTERECTOMY      OB History   No obstetric history on file.      Home Medications    Prior to Admission medications   Medication Sig Start Date End Date Taking? Authorizing Provider  albuterol (PROVENTIL HFA;VENTOLIN HFA) 108 (90 Base) MCG/ACT inhaler Inhale 2 puffs into the lungs every 6 (six) hours as needed for wheezing or shortness of  breath. 08/05/17  Yes Juline Patch, MD  amLODipine (NORVASC) 10 MG tablet Take 1 tablet (10 mg total) by mouth daily. 08/05/17 07/15/20 Yes Juline Patch, MD  aspirin 81 MG tablet Take 81 mg by mouth daily.   Yes [provider]  BIOTIN 5000 PO Take 1 capsule by mouth daily.   Yes [provider]  carvedilol (COREG) 25 MG tablet Take 1 tablet (25 mg total) by mouth 2 (two) times daily. 08/05/17 07/15/20 Yes Juline Patch, MD  Cholecalciferol (VITAMIN D3) 5000 UNITS CAPS Take 1 capsule by mouth daily.   Yes [provider]  glipiZIDE (GLIPIZIDE XL) 5 MG 24 hr tablet TAKE 1 TABLET BY MOUTH WITH BREAKFAST 03/22/18  Yes Otilio Miu C, MD  glucose blood test strip One a day testing 05/26/16  Yes Juline Patch, MD  losartan (COZAAR) 50 MG tablet TAKE 1 TABLET BY MOUTH ONCE DAILY 03/25/18  Yes Juline Patch, MD  lovastatin (MEVACOR) 40 MG tablet Take 1 tablet (40 mg total) by mouth daily. 08/05/17  Yes Juline Patch, MD  metFORMIN (GLUCOPHAGE) 1000 MG tablet Take 1 tablet (1,000 mg total) by mouth 2 (two) times daily. 08/05/17  Yes Juline Patch, MD  naproxen (NAPROSYN) 500 MG tablet Take 500 mg by mouth as needed. otc  Yes [provider]  Omega-3 300 MG CAPS Take 1 capsule by mouth daily. 08/05/17  Yes Juline Patch, MD  mupirocin ointment (BACTROBAN) 2 % Apply 1 application topically 2 (two) times daily. Patient not taking: Reported on 08/05/2017 04/04/17   Juline Patch, MD  pantoprazole (PROTONIX) 40 MG tablet Take 1 tablet (40 mg total) by mouth daily. 07/15/20   Coral Spikes, DO  traMADol (ULTRAM) 50 MG tablet Take 1 tablet (50 mg total) by mouth every 8 (eight) hours as needed. 07/15/20   Coral Spikes, DO  traZODone (DESYREL) 50 MG tablet Take 0.5-1 tablets (25-50 mg total) by mouth at bedtime as needed for sleep. 08/05/17 07/15/20  Juline Patch, MD    Family History Family History  Problem Relation Age of Onset  . Cancer Mother   .  Diabetes Mother   . Diabetes Father   . Diabetes Maternal Grandfather   . Stroke Maternal Grandfather   . Diabetes Paternal Grandfather   . Breast cancer Neg Hx     Social History Social History   Tobacco Use  . Smoking status: Former Smoker    Quit date: 12/08/2011    Years since quitting: 8.6  . Smokeless tobacco: Never Used  Vaping Use  . Vaping Use: Never used  Substance Use Topics  . Alcohol use: No    Alcohol/week: 0.0 standard drinks  . Drug use: No     Allergies   Metronidazole, Penicillins, and Tetracyclines & related   Review of Systems Review of Systems Per HPI  Physical Exam Triage Vital Signs ED Triage Vitals  Enc Vitals Group     BP 07/15/20 1004 (!) 157/75     Pulse Rate 07/15/20 1004 67     Resp 07/15/20 1004 20     Temp 07/15/20 1004 97.8 F (36.6 C)     Temp Source 07/15/20 1004 Oral     SpO2 07/15/20 1004 97 %     Weight 07/15/20 1001 183 lb (83 kg)     Height 07/15/20 1001 5\' 6"  (1.676 m)     Head Circumference --      Peak Flow --      Pain Score 07/15/20 1001 5     Pain Loc --      Pain Edu? --      Excl. in [place]? --    Updated Vital Signs BP (!) 157/75 (BP Location: Left Arm)   Pulse 67   Temp 97.8 F (36.6 C) (Oral)   Resp 20   Ht 5\' 6"  (1.676 m)   Wt 83 kg   SpO2 97%   BMI 29.54 kg/m   Visual Acuity Right Eye Distance:   Left Eye Distance:   Bilateral Distance:    Right Eye Near:   Left Eye Near:    Bilateral Near:     Physical Exam Vitals and nursing note reviewed.  Constitutional:      General: She is not in acute distress.    Appearance: Normal appearance. She is not ill-appearing.  HENT:     Head: Normocephalic and atraumatic.  Cardiovascular:     Rate and Rhythm: Normal rate and regular rhythm.     Heart sounds: No murmur heard.   Pulmonary:     Effort: Pulmonary effort is normal.     Breath sounds: Normal breath sounds. No wheezing, rhonchi or rales.  Abdominal:     General: There is no distension.      Palpations:  Abdomen is soft.     Comments: RUQ tenderness to palpation.  Neurological:     Mental Status: She is alert.  Psychiatric:        Mood and Affect: Mood normal.        Behavior: Behavior normal.    UC Treatments / Results  Labs (all labs ordered are listed, but only abnormal results are displayed) Labs Reviewed  CBC WITH DIFFERENTIAL/PLATELET - Abnormal; Notable for the following components:      Result Value   Platelets 146 (*)    All other components within normal limits  COMPREHENSIVE METABOLIC PANEL - Abnormal; Notable for the following components:   Glucose, Bld 240 (*)    All other components within normal limits  LIPASE, BLOOD  TROPONIN I (HIGH SENSITIVITY)    EKG Interpretation: Sinus bradycardia with a rate of 59.  Left anterior fascicular block.  Flattening of T waves noted in lead III.  No appreciable ST elevation.  Radiology DG Chest 2 View  Result Date: 07/15/2020 CLINICAL DATA:  Chest pain EXAM: CHEST - 2 VIEW COMPARISON:  02/15/2001 chest radiograph report. FINDINGS: No focal consolidation. No pneumothorax or pleural effusion. Cardiomediastinal silhouette within normal limits. No acute or focal osseous abnormality. Multilevel spondylosis. IMPRESSION: No focal airspace disease. Electronically Signed   By: Primitivo Gauze M.D.   On: 07/15/2020 10:50    Procedures Procedures (including critical care time)  Medications Ordered in UC Medications - No data to display  Initial Impression / Assessment and Plan / UC Course  I have reviewed the triage vital signs and the nursing notes.  Pertinent labs & imaging results that were available during my care of the patient were reviewed by me and considered in my medical decision making (see chart for details).    74 year old female presents with ongoing arm pain, chest pain, back pain.  Laboratory studies normal other than mild thrombocytopenia and elevated glucose.  Troponin was negative.  Chest x-ray was  obtained and was independently reviewed by me.  Interpretation: No acute infiltrate.  Unremarkable x-ray.  Treating with tramadol.  Placing on Protonix to cover for possible GERD.  Advised to go to the hospital if she worsens.  Final Clinical Impressions(s) / UC Diagnoses   Final diagnoses:  Atypical chest pain  Acute right-sided thoracic back pain     Discharge Instructions     Medication as prescribed.  If you worsen, go to the ER.  Take care  Dr. Lacinda Axon    ED Prescriptions    Medication Sig Dispense Auth. Provider   traMADol (ULTRAM) 50 MG tablet Take 1 tablet (50 mg total) by mouth every 8 (eight) hours as needed. 15 tablet Darnelle Corp G, DO   pantoprazole (PROTONIX) 40 MG tablet Take 1 tablet (40 mg total) by mouth daily. 30 tablet Thersa Salt G, DO     I have reviewed the PDMP during this encounter.   Coral Spikes, [place] 07/15/20 1219

## 2020-07-15 NOTE — Discharge Instructions (Signed)
Medication as prescribed. ° °If you worsen, go to the ER. ° °Take care ° °Dr. Gladis Soley  °

## 2020-07-15 NOTE — ED Triage Notes (Signed)
Pt c/o pain that starts in her right shoulder, and radiates to her chest and this morning it started radiating to her back. She states it has been going on for about a week and a half but has gotten worse in the last 2 days.

## 2020-07-25 DIAGNOSIS — Z9049 Acquired absence of other specified parts of digestive tract: Secondary | ICD-10-CM | POA: Insufficient documentation

## 2020-09-02 ENCOUNTER — Other Ambulatory Visit: Payer: Self-pay | Admitting: Pediatrics

## 2020-09-02 DIAGNOSIS — Z1231 Encounter for screening mammogram for malignant neoplasm of breast: Secondary | ICD-10-CM

## 2020-09-12 ENCOUNTER — Other Ambulatory Visit: Payer: Self-pay

## 2020-09-12 DIAGNOSIS — I10 Essential (primary) hypertension: Secondary | ICD-10-CM | POA: Insufficient documentation

## 2020-09-12 DIAGNOSIS — F419 Anxiety disorder, unspecified: Secondary | ICD-10-CM | POA: Insufficient documentation

## 2020-09-12 DIAGNOSIS — Z8542 Personal history of malignant neoplasm of other parts of uterus: Secondary | ICD-10-CM | POA: Insufficient documentation

## 2020-09-15 ENCOUNTER — Encounter: Payer: Self-pay | Admitting: Gastroenterology

## 2020-09-15 ENCOUNTER — Other Ambulatory Visit: Payer: Self-pay

## 2020-09-15 ENCOUNTER — Ambulatory Visit: Payer: Medicare Other | Admitting: Gastroenterology

## 2020-09-15 VITALS — BP 128/74 | HR 67 | Temp 97.4°F | Ht 67.0 in | Wt 191.0 lb

## 2020-09-15 DIAGNOSIS — K219 Gastro-esophageal reflux disease without esophagitis: Secondary | ICD-10-CM

## 2020-09-15 DIAGNOSIS — R0789 Other chest pain: Secondary | ICD-10-CM

## 2020-09-15 DIAGNOSIS — R1084 Generalized abdominal pain: Secondary | ICD-10-CM

## 2020-09-15 DIAGNOSIS — Z860100 Personal history of colon polyps, unspecified: Secondary | ICD-10-CM

## 2020-09-15 MED ORDER — PANTOPRAZOLE SODIUM 40 MG PO TBEC
40.0000 mg | DELAYED_RELEASE_TABLET | Freq: Every day | ORAL | 6 refills | Status: DC
Start: 2020-09-15 — End: 2020-09-15

## 2020-09-15 MED ORDER — SUPREP BOWEL PREP KIT 17.5-3.13-1.6 GM/177ML PO SOLN: 1.0000 | ORAL | 0 refills | Status: AC

## 2020-09-15 MED ORDER — PANTOPRAZOLE SODIUM 40 MG PO TBEC: 40.0000 mg | DELAYED_RELEASE_TABLET | Freq: Two times a day (BID) | ORAL | 6 refills | Status: AC

## 2020-09-15 NOTE — H&P (View-Only) (Signed)
Gastroenterology Consultation  Referring Provider:     Barbaraann Boys, MD Primary Care Physician:  Barbaraann Boys, MD Primary Gastroenterologist:  Dr. Allen Norris     Reason for Consultation:     Chest pain        HPI:   Sara Atkinson is a 75 y.o. y/o female referred for consultation & management of chest pain by Dr. Barbaraann Boys, MD.  This patient comes in today with a history of chest pain for which she was seen in urgent care.  The patient follow-up with her primary care provider who suggested she be on pantoprazole and Pepcid when she was seen.  She was told to take the Pepcid as needed when she may eat something that does not agree with her.  The patient had an upper endoscopy back in 2015 with what appears to be gastric intestinal metaplasia.  It was also reported that the patient is due for colonoscopy.  In December the patient had blood work that showed her to have normal liver enzymes and her CBC showed her platelet count to be slightly lower than the lower limit of normal.  She says she is not able to eat but has gained weight. She also says only 2 protonix tabs taken q12 hours helps her.  Patient reports that sometimes she will get the pain in the middle the night that wake her up from sleep.  There is many times when she will be going throughout her day and the pain will come on suddenly.  She reports that the pain radiates to her right shoulder and goes to her back.  She says it is between her shoulder blades.  The patient has had her gallbladder out in the past.  She states that she is very active and this is really impinged on her daily activities.  Although the PPI does help her significantly she states that it does not relieve the symptoms completely  Past Medical History:  Diagnosis Date  . Anxiety   . 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 Diabetes mellitus without complication ([place])   . Hyperlipidemia   . Hypertension     Past Surgical History:  Procedure Laterality Date  .  BREAST BIOPSY Left 2011   neg almost in cleavage  . COLONOSCOPY  10/17/2014   cleared for 3 years/ polyps- Dr Allen Norris  . CYSTOCELE REPAIR    . OOPHORECTOMY Bilateral 1971  . VAGINAL HYSTERECTOMY      Prior to Admission medications   Medication Sig Start Date End Date Taking? Authorizing Provider  albuterol (PROVENTIL HFA;VENTOLIN HFA) 108 (90 Base) MCG/ACT inhaler Inhale 2 puffs into the lungs every 6 (six) hours as needed for wheezing or shortness of breath. 08/05/17   Juline Patch, MD  amLODipine (NORVASC) 10 MG tablet Take 1 tablet (10 mg total) by mouth daily. 08/05/17 07/15/20  Juline Patch, MD  aspirin 81 MG tablet Take 81 mg by mouth daily.    [provider]  BIOTIN 5000 PO Take 1 capsule by mouth daily.    [provider]  carvedilol (COREG) 25 MG tablet Take 1 tablet (25 mg total) by mouth 2 (two) times daily. 08/05/17 07/15/20  Juline Patch, MD  Cholecalciferol (VITAMIN D3) 5000 UNITS CAPS Take 1 capsule by mouth daily.    [provider]  cyanocobalamin (,VITAMIN B-12,) 1000 MCG/ML injection cyanocobalamin (vit B-12) 1,000 mcg/mL injection solution  INJECT 1 ML ONE TIME EVERY MONTH    [provider]  glipiZIDE (GLIPIZIDE XL) 5 MG 24 hr tablet TAKE 1 TABLET BY MOUTH WITH BREAKFAST 03/22/18   Jones, Deanna C, MD  glucose blood test strip One a day testing 05/26/16   Jones, Deanna C, MD  losartan (COZAAR) 50 MG tablet TAKE 1 TABLET BY MOUTH ONCE DAILY 03/25/18   Jones, Deanna C, MD  lovastatin (MEVACOR) 40 MG tablet Take 1 tablet (40 mg total) by mouth daily. 08/05/17   Jones, Deanna C, MD  magnesium oxide (MAG-OX) 400 (241.3 Mg) MG tablet Take 1 tablet by mouth 2 (two) times daily. 04/18/20   [provider]  metFORMIN (GLUCOPHAGE) 1000 MG tablet Take 1 tablet (1,000 mg total) by mouth 2 (two) times daily. 08/05/17   Jones, Deanna C, MD  mupirocin ointment (BACTROBAN) 2 % Apply 1 application topically 2 (two) times daily. Patient not  taking: Reported on 08/05/2017 04/04/17   Jones, Deanna C, MD  naproxen (NAPROSYN) 500 MG tablet Take 500 mg by mouth as needed. otc    [provider]  Omega-3 300 MG CAPS Take 1 capsule by mouth daily. 08/05/17   Jones, Deanna C, MD  pantoprazole (PROTONIX) 40 MG tablet Take 1 tablet (40 mg total) by mouth daily. 07/15/20   Cook, Jayce G, DO  traMADol (ULTRAM) 50 MG tablet Take 1 tablet (50 mg total) by mouth every 8 (eight) hours as needed. 07/15/20   Cook, Jayce G, DO  traZODone (DESYREL) 50 MG tablet Take 0.5-1 tablets (25-50 mg total) by mouth at bedtime as needed for sleep. 08/05/17 07/15/20  Jones, Deanna C, MD    Family History  Problem Relation Age of Onset  . Cancer Mother   . Diabetes Mother   . Diabetes Father   . Diabetes Maternal Grandfather   . Stroke Maternal Grandfather   . Diabetes Paternal Grandfather   . Breast cancer Neg Hx      Social History   Tobacco Use  . Smoking status: Former Smoker    Quit date: 12/08/2011    Years since quitting: 8.7  . Smokeless tobacco: Never Used  Vaping Use  . Vaping Use: Never used  Substance Use Topics  . Alcohol use: No    Alcohol/week: 0.0 standard drinks  . Drug use: No    Allergies as of 09/15/2020 - Review Complete 09/15/2020  Allergen Reaction Noted  . Metronidazole Anaphylaxis 06/16/2015  . Penicillins Anaphylaxis and Hives 06/16/2015  . Tetracyclines & related Hives 06/16/2015  . Montelukast  02/27/2019    Review of Systems:    All systems reviewed and negative except where noted in HPI.   Physical Exam:  BP 128/74   Pulse 67   Temp (!) 97.4 F (36.3 C) (Temporal)   Ht 5' 7" (1.702 m)   Wt 191 lb (86.6 kg)   BMI 29.91 kg/m  No LMP recorded. Patient has had a hysterectomy. General:   Alert,  Well-developed, well-nourished, pleasant and cooperative in NAD Head:  Normocephalic and atraumatic. Eyes:  Sclera clear, no icterus.   Conjunctiva pink. Ears:  Normal auditory acuity. Neck:  Supple; no  masses or thyromegaly. Lungs:  Respirations even and unlabored.  Clear throughout to auscultation.   No wheezes, crackles, or rhonchi. No acute distress. Heart:  Regular rate and rhythm; no murmurs, clicks, rubs, or gallops. Abdomen:  Normal bowel sounds.  No bruits.  Soft, non-tender and non-distended without masses, or hernias noted.  No guarding or rebound tenderness.  Negative Carnett sign.  There appears   to be hepatomegaly. Rectal:  Deferred.  Pulses:  Normal pulses noted. Extremities:  No clubbing or edema.  No cyanosis. Neurologic:  Alert and oriented x3;  grossly normal neurologically. Skin:  Intact without significant lesions or rashes.  No jaundice. Lymph Nodes:  No significant cervical adenopathy. Psych:  Alert and cooperative. Normal mood and affect.  Imaging Studies: No results found.  Assessment and Plan:   Sara Atkinson is a 75 y.o. y/o female who comes in today with significant reflux and chest pain that radiates to her back between her shoulder blades in her right shoulder.  She also has a history of colon polyps and is in need of a colonoscopy.  The patient will be set up for an EGD and colonoscopy.  The patient has also been found to have what feels like an enlarged liver and will be set up for an abdominal ultrasound.  She will also have her Protonix refilled.  The patient has been explained the plan and agrees with it.    [name], MD. Marval Regal    Note: This dictation was prepared with Dragon dictation along with smaller phrase technology. Any transcriptional errors that result from this process are unintentional.

## 2020-09-15 NOTE — Progress Notes (Signed)
Gastroenterology Consultation  Referring Provider:     Barbaraann Boys, MD Primary Care Physician:  Barbaraann Boys, MD Primary Gastroenterologist:  Dr. Allen Norris     Reason for Consultation:     Chest pain        HPI:   Sara Atkinson is a 75 y.o. y/o female referred for consultation & management of chest pain by Dr. Barbaraann Boys, MD.  This patient comes in today with a history of chest pain for which she was seen in urgent care.  The patient follow-up with her primary care provider who suggested she be on pantoprazole and Pepcid when she was seen.  She was told to take the Pepcid as needed when she may eat something that does not agree with her.  The patient had an upper endoscopy back in 2015 with what appears to be gastric intestinal metaplasia.  It was also reported that the patient is due for colonoscopy.  In December the patient had blood work that showed her to have normal liver enzymes and her CBC showed her platelet count to be slightly lower than the lower limit of normal.  She says she is not able to eat but has gained weight. She also says only 2 protonix tabs taken q12 hours helps her.  Patient reports that sometimes she will get the pain in the middle the night that wake her up from sleep.  There is many times when she will be going throughout her day and the pain will come on suddenly.  She reports that the pain radiates to her right shoulder and goes to her back.  She says it is between her shoulder blades.  The patient has had her gallbladder out in the past.  She states that she is very active and this is really impinged on her daily activities.  Although the PPI does help her significantly she states that it does not relieve the symptoms completely  Past Medical History:  Diagnosis Date  . Anxiety   . 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 Diabetes mellitus without complication ([place])   . Hyperlipidemia   . Hypertension     Past Surgical History:  Procedure Laterality Date  .  BREAST BIOPSY Left 2011   neg almost in cleavage  . COLONOSCOPY  10/17/2014   cleared for 3 years/ polyps- Dr Allen Norris  . CYSTOCELE REPAIR    . OOPHORECTOMY Bilateral 1971  . VAGINAL HYSTERECTOMY      Prior to Admission medications   Medication Sig Start Date End Date Taking? Authorizing Provider  albuterol (PROVENTIL HFA;VENTOLIN HFA) 108 (90 Base) MCG/ACT inhaler Inhale 2 puffs into the lungs every 6 (six) hours as needed for wheezing or shortness of breath. 08/05/17   Juline Patch, MD  amLODipine (NORVASC) 10 MG tablet Take 1 tablet (10 mg total) by mouth daily. 08/05/17 07/15/20  Juline Patch, MD  aspirin 81 MG tablet Take 81 mg by mouth daily.    [provider]  BIOTIN 5000 PO Take 1 capsule by mouth daily.    [provider]  carvedilol (COREG) 25 MG tablet Take 1 tablet (25 mg total) by mouth 2 (two) times daily. 08/05/17 07/15/20  Juline Patch, MD  Cholecalciferol (VITAMIN D3) 5000 UNITS CAPS Take 1 capsule by mouth daily.    [provider]  cyanocobalamin (,VITAMIN B-12,) 1000 MCG/ML injection cyanocobalamin (vit B-12) 1,000 mcg/mL injection solution  INJECT 1 ML ONE TIME EVERY MONTH    [provider]  glipiZIDE (GLIPIZIDE XL) 5 MG 24 hr tablet TAKE 1 TABLET BY MOUTH WITH BREAKFAST 03/22/18   Otilio Miu C, MD  glucose blood test strip One a day testing 05/26/16   Juline Patch, MD  losartan (COZAAR) 50 MG tablet TAKE 1 TABLET BY MOUTH ONCE DAILY 03/25/18   Juline Patch, MD  lovastatin (MEVACOR) 40 MG tablet Take 1 tablet (40 mg total) by mouth daily. 08/05/17   Juline Patch, MD  magnesium oxide (MAG-OX) 400 (241.3 Mg) MG tablet Take 1 tablet by mouth 2 (two) times daily. 04/18/20   [provider]  metFORMIN (GLUCOPHAGE) 1000 MG tablet Take 1 tablet (1,000 mg total) by mouth 2 (two) times daily. 08/05/17   Juline Patch, MD  mupirocin ointment (BACTROBAN) 2 % Apply 1 application topically 2 (two) times daily. Patient not  taking: Reported on 08/05/2017 04/04/17   Juline Patch, MD  naproxen (NAPROSYN) 500 MG tablet Take 500 mg by mouth as needed. otc    [provider]  Omega-3 300 MG CAPS Take 1 capsule by mouth daily. 08/05/17   Juline Patch, MD  pantoprazole (PROTONIX) 40 MG tablet Take 1 tablet (40 mg total) by mouth daily. 07/15/20   Coral Spikes, DO  traMADol (ULTRAM) 50 MG tablet Take 1 tablet (50 mg total) by mouth every 8 (eight) hours as needed. 07/15/20   Coral Spikes, DO  traZODone (DESYREL) 50 MG tablet Take 0.5-1 tablets (25-50 mg total) by mouth at bedtime as needed for sleep. 08/05/17 07/15/20  Juline Patch, MD    Family History  Problem Relation Age of Onset  . Cancer Mother   . Diabetes Mother   . Diabetes Father   . Diabetes Maternal Grandfather   . Stroke Maternal Grandfather   . Diabetes Paternal Grandfather   . Breast cancer Neg Hx      Social History   Tobacco Use  . Smoking status: Former Smoker    Quit date: 12/08/2011    Years since quitting: 8.7  . Smokeless tobacco: Never Used  Vaping Use  . Vaping Use: Never used  Substance Use Topics  . Alcohol use: No    Alcohol/week: 0.0 standard drinks  . Drug use: No    Allergies as of 09/15/2020 - Review Complete 09/15/2020  Allergen Reaction Noted  . Metronidazole Anaphylaxis 06/16/2015  . Penicillins Anaphylaxis and Hives 06/16/2015  . Tetracyclines & related Hives 06/16/2015  . Montelukast  02/27/2019    Review of Systems:    All systems reviewed and negative except where noted in HPI.   Physical Exam:  BP 128/74   Pulse 67   Temp (!) 97.4 F (36.3 C) (Temporal)   Ht 5\' 7"  (1.702 m)   Wt 191 lb (86.6 kg)   BMI 29.91 kg/m  No LMP recorded. Patient has had a hysterectomy. General:   Alert,  Well-developed, well-nourished, pleasant and cooperative in NAD Head:  Normocephalic and atraumatic. Eyes:  Sclera clear, no icterus.   Conjunctiva pink. Ears:  Normal auditory acuity. Neck:  Supple; no  masses or thyromegaly. Lungs:  Respirations even and unlabored.  Clear throughout to auscultation.   No wheezes, crackles, or rhonchi. No acute distress. Heart:  Regular rate and rhythm; no murmurs, clicks, rubs, or gallops. Abdomen:  Normal bowel sounds.  No bruits.  Soft, non-tender and non-distended without masses, or hernias noted.  No guarding or rebound tenderness.  Negative Carnett sign.  There appears  to be hepatomegaly. Rectal:  Deferred.  Pulses:  Normal pulses noted. Extremities:  No clubbing or edema.  No cyanosis. Neurologic:  Alert and oriented x3;  grossly normal neurologically. Skin:  Intact without significant lesions or rashes.  No jaundice. Lymph Nodes:  No significant cervical adenopathy. Psych:  Alert and cooperative. Normal mood and affect.  Imaging Studies: No results found.  Assessment and Plan:   STARKEISHA VANWINKLE is a 75 y.o. y/o female who comes in today with significant reflux and chest pain that radiates to her back between her shoulder blades in her right shoulder.  She also has a history of colon polyps and is in need of a colonoscopy.  The patient will be set up for an EGD and colonoscopy.  The patient has also been found to have what feels like an enlarged liver and will be set up for an abdominal ultrasound.  She will also have her Protonix refilled.  The patient has been explained the plan and agrees with it.    [name], MD. Marval Regal    Note: This dictation was prepared with Dragon dictation along with smaller phrase technology. Any transcriptional errors that result from this process are unintentional.

## 2020-09-15 NOTE — Addendum Note (Signed)
Addended byGlennie Isle E on: 09/15/2020 02:07 PM   Modules accepted: Orders

## 2020-09-17 ENCOUNTER — Other Ambulatory Visit: Payer: Self-pay

## 2020-09-17 ENCOUNTER — Other Ambulatory Visit
Admission: RE | Admit: 2020-09-17 | Discharge: 2020-09-17 | Disposition: A | Discharge status: Home / Self Care | Payer: Medicare Other | Source: Ambulatory Visit | Attending: Gastroenterology | Admitting: Gastroenterology

## 2020-09-17 ENCOUNTER — Encounter: Payer: Self-pay | Admitting: Gastroenterology

## 2020-09-17 DIAGNOSIS — Z01812 Encounter for preprocedural laboratory examination: Secondary | ICD-10-CM | POA: Diagnosis present

## 2020-09-17 DIAGNOSIS — Z20822 Contact with and (suspected) exposure to covid-19: Secondary | ICD-10-CM | POA: Diagnosis not present

## 2020-09-17 LAB — SARS CORONAVIRUS 2 (TAT 6-24 HRS): SARS Coronavirus 2: NEGATIVE

## 2020-09-18 NOTE — Discharge Instructions (Signed)

## 2020-09-19 ENCOUNTER — Ambulatory Visit
Admission: RE | Admit: 2020-09-19 | Discharge: 2020-09-19 | Disposition: A | Discharge status: Home / Self Care | Payer: Medicare Other | Source: Ambulatory Visit | Attending: Gastroenterology | Admitting: Gastroenterology

## 2020-09-19 ENCOUNTER — Other Ambulatory Visit: Payer: Self-pay

## 2020-09-19 ENCOUNTER — Encounter: Payer: Self-pay | Admitting: Gastroenterology

## 2020-09-19 ENCOUNTER — Ambulatory Visit: Payer: Medicare Other | Admitting: Anesthesiology

## 2020-09-19 ENCOUNTER — Encounter
Admission: RE | Disposition: A | Discharge status: Home / Self Care | Payer: Self-pay | Source: Ambulatory Visit | Attending: Gastroenterology

## 2020-09-19 DIAGNOSIS — K219 Gastro-esophageal reflux disease without esophagitis: Secondary | ICD-10-CM | POA: Diagnosis present

## 2020-09-19 DIAGNOSIS — Z809 Family history of malignant neoplasm, unspecified: Secondary | ICD-10-CM | POA: Diagnosis not present

## 2020-09-19 DIAGNOSIS — Z888 Allergy status to other drugs, medicaments and biological substances status: Secondary | ICD-10-CM | POA: Diagnosis not present

## 2020-09-19 DIAGNOSIS — K317 Polyp of stomach and duodenum: Secondary | ICD-10-CM | POA: Insufficient documentation

## 2020-09-19 DIAGNOSIS — Z79899 Other long term (current) drug therapy: Secondary | ICD-10-CM | POA: Insufficient documentation

## 2020-09-19 DIAGNOSIS — Z860100 Personal history of colon polyps, unspecified: Secondary | ICD-10-CM

## 2020-09-19 DIAGNOSIS — Z1211 Encounter for screening for malignant neoplasm of colon: Secondary | ICD-10-CM | POA: Diagnosis not present

## 2020-09-19 DIAGNOSIS — Z7984 Long term (current) use of oral hypoglycemic drugs: Secondary | ICD-10-CM | POA: Diagnosis not present

## 2020-09-19 DIAGNOSIS — K2970 Gastritis, unspecified, without bleeding: Secondary | ICD-10-CM | POA: Diagnosis not present

## 2020-09-19 DIAGNOSIS — Z823 Family history of stroke: Secondary | ICD-10-CM | POA: Diagnosis not present

## 2020-09-19 DIAGNOSIS — K640 First degree hemorrhoids: Secondary | ICD-10-CM | POA: Insufficient documentation

## 2020-09-19 DIAGNOSIS — Z8542 Personal history of malignant neoplasm of other parts of uterus: Secondary | ICD-10-CM | POA: Diagnosis not present

## 2020-09-19 DIAGNOSIS — Z7982 Long term (current) use of aspirin: Secondary | ICD-10-CM | POA: Insufficient documentation

## 2020-09-19 DIAGNOSIS — K5730 Diverticulosis of large intestine without perforation or abscess without bleeding: Secondary | ICD-10-CM | POA: Insufficient documentation

## 2020-09-19 DIAGNOSIS — Z881 Allergy status to other antibiotic agents status: Secondary | ICD-10-CM | POA: Diagnosis not present

## 2020-09-19 DIAGNOSIS — Z833 Family history of diabetes mellitus: Secondary | ICD-10-CM | POA: Diagnosis not present

## 2020-09-19 DIAGNOSIS — R0789 Other chest pain: Secondary | ICD-10-CM

## 2020-09-19 DIAGNOSIS — K319 Disease of stomach and duodenum, unspecified: Secondary | ICD-10-CM | POA: Insufficient documentation

## 2020-09-19 DIAGNOSIS — Z87891 Personal history of nicotine dependence: Secondary | ICD-10-CM | POA: Insufficient documentation

## 2020-09-19 DIAGNOSIS — Z880 Allergy status to penicillin: Secondary | ICD-10-CM | POA: Insufficient documentation

## 2020-09-19 HISTORY — DX: Chronic obstructive pulmonary disease, unspecified: J44.9

## 2020-09-19 HISTORY — DX: Gastro-esophageal reflux disease without esophagitis: K21.9

## 2020-09-19 HISTORY — PX: COLONOSCOPY WITH PROPOFOL: SHX5780

## 2020-09-19 HISTORY — PX: ESOPHAGOGASTRODUODENOSCOPY (EGD) WITH PROPOFOL: SHX5813

## 2020-09-19 HISTORY — DX: Dyspnea, unspecified: R06.00

## 2020-09-19 LAB — GLUCOSE, CAPILLARY: Glucose-Capillary: 171 mg/dL — ABNORMAL HIGH (ref 70–99)

## 2020-09-19 SURGERY — COLONOSCOPY WITH PROPOFOL
Anesthesia: General | Site: Throat

## 2020-09-19 MED ORDER — SODIUM CHLORIDE 0.9 % IV SOLN: INTRAVENOUS | Status: DC

## 2020-09-19 MED ORDER — GLYCOPYRROLATE 0.2 MG/ML IJ SOLN
INTRAMUSCULAR | Status: DC | PRN
  Administered 2020-09-19: .1 mg via INTRAVENOUS

## 2020-09-19 MED ORDER — LACTATED RINGERS IV SOLN: INTRAVENOUS | Status: DC

## 2020-09-19 MED ORDER — PROPOFOL 10 MG/ML IV BOLUS
INTRAVENOUS | Status: DC | PRN
  Administered 2020-09-19: 20 mg via INTRAVENOUS
  Administered 2020-09-19: 70 mg via INTRAVENOUS
  Administered 2020-09-19: 30 mg via INTRAVENOUS
  Administered 2020-09-19: 20 mg via INTRAVENOUS
  Administered 2020-09-19: 30 mg via INTRAVENOUS
  Administered 2020-09-19 (×3): 20 mg via INTRAVENOUS
  Administered 2020-09-19 (×2): 30 mg via INTRAVENOUS
  Administered 2020-09-19: 20 mg via INTRAVENOUS

## 2020-09-19 MED ORDER — LIDOCAINE HCL (CARDIAC) PF 100 MG/5ML IV SOSY
PREFILLED_SYRINGE | INTRAVENOUS | Status: DC | PRN
  Administered 2020-09-19: 50 mg via INTRAVENOUS

## 2020-09-19 MED ORDER — STERILE WATER FOR IRRIGATION IR SOLN
Status: DC | PRN
  Administered 2020-09-19: .05 mL

## 2020-09-19 SURGICAL SUPPLY — 8 items
BLOCK BITE 60FR ADLT L/F GRN (MISCELLANEOUS) ×3 IMPLANT
FORCEPS BIOP RAD 4 LRG CAP 4 (CUTTING FORCEPS) ×2 IMPLANT
GOWN CVR UNV OPN BCK APRN NK (MISCELLANEOUS) ×4 IMPLANT
GOWN ISOL THUMB LOOP REG UNIV (MISCELLANEOUS) ×6
KIT PRC NS LF DISP ENDO (KITS) ×2 IMPLANT
KIT PROCEDURE OLYMPUS (KITS) ×3
MANIFOLD NEPTUNE II (INSTRUMENTS) ×3 IMPLANT
WATER STERILE IRR 250ML POUR (IV SOLUTION) ×3 IMPLANT

## 2020-09-19 NOTE — Op Note (Signed)
[facility] Gastroenterology Patient Name: Sara Atkinson Procedure Date: 09/19/2020 10:16 AM MRN: [id number] Account #: [id number] Date of Birth: [birth year] Admit Type: Outpatient Age: 75 Room: [facility] OR ROOM 01 Gender: Female Note Status: Finalized Procedure:             Upper GI endoscopy Indications:           Chest pain (non cardiac) Providers:             [name] MD, MD Referring MD:          Ane Payment, MD (Referring MD) Medicines:             Propofol per Anesthesia Complications:         No immediate complications. Procedure:             Pre-Anesthesia Assessment:                        - Prior to the procedure, a History and Physical was                         performed, and patient medications and allergies were                         reviewed. The patient's tolerance of previous                         anesthesia was also reviewed. The risks and benefits                         of the procedure and the sedation options and risks                         were discussed with the patient. All questions were                         answered, and informed consent was obtained. Prior                         Anticoagulants: The patient has taken no previous                         anticoagulant or antiplatelet agents. ASA Grade                         Assessment: II - A patient with mild systemic disease.                         After reviewing the risks and benefits, the patient                         was deemed in satisfactory condition to undergo the                         procedure.                        After obtaining informed consent, the endoscope was  passed under direct vision. Throughout the procedure,                         the patient's blood pressure, pulse, and oxygen                         saturations were monitored continuously. The Endoscope                         was introduced through the mouth, and  advanced to the                         second part of duodenum. The upper GI endoscopy was                         accomplished without difficulty. The patient tolerated                         the procedure well. Findings:      The examined esophagus was normal.      Multiple 3 mm sessile polyps with no bleeding and no stigmata of recent       bleeding were found in the gastric body. Biopsies were taken with a cold       forceps for histology.      Moderate inflammation characterized by erythema was found in the gastric       antrum. Biopsies were taken with a cold forceps for histology.      The examined duodenum was normal. Impression:            - Normal esophagus.                        - Multiple gastric polyps. Biopsied.                        - Gastritis. Biopsied.                        - Normal examined duodenum. Recommendation:        - Await pathology results.                        - Discharge patient to home.                        - Resume previous diet.                        - Continue present medications.                        - Await pathology results.                        - Perform a colonoscopy today. Procedure Code(s):     --- Professional ---                        [phone], Esophagogastroduodenoscopy, flexible,                         transoral; with biopsy, single or multiple  Diagnosis Code(s):     --- Professional ---                        R07.89, Other chest pain                        K29.70, Gastritis, unspecified, without bleeding                        K31.7, Polyp of stomach and duodenum CPT copyright 2019 American [employer] Association. All rights reserved. The codes documented in this report are preliminary and upon coder review may  be revised to meet current compliance requirements. [name] MD, MD 09/19/2020 10:29:09 AM This report has been signed electronically. Number of Addenda: 0 Note Initiated On: 09/19/2020 10:16 AM Total Procedure  Duration: 0 hours 3 minutes 34 seconds  Estimated Blood Loss:  Estimated blood loss: none.      [facility]

## 2020-09-19 NOTE — Transfer of Care (Signed)
Immediate Anesthesia Transfer of Care Note  Patient: Sara Atkinson  Procedure(s) Performed: COLONOSCOPY WITH PROPOFOL (N/A Rectum) ESOPHAGOGASTRODUODENOSCOPY (EGD) WITH PROPOFOL (N/A Throat)  Patient Location: PACU  Anesthesia Type: General  Level of Consciousness: awake, alert  and patient cooperative  Airway and Oxygen Therapy: Patient Spontanous Breathing   Post-op Assessment: Post-op Vital signs reviewed, Patient's Cardiovascular Status Stable, Respiratory Function Stable, Patent Airway and No signs of Nausea or vomiting  Post-op Vital Signs: Reviewed and stable  Complications: No complications documented.

## 2020-09-19 NOTE — Op Note (Signed)
[facility] Gastroenterology Patient Name: Sara Atkinson Procedure Date: 09/19/2020 10:16 AM MRN: [id number] Account #: [id number] Date of Birth: 12-17-1945 Admit Type: Outpatient Age: 75 Room: [facility] OR ROOM 01 Gender: Female Note Status: Finalized Procedure:             Colonoscopy Indications:           High risk colon cancer surveillance: Personal history                         of colonic polyps Providers:             [name] MD, MD Referring MD:          Ane Payment, MD (Referring MD) Medicines:             Propofol per Anesthesia Complications:         No immediate complications. Procedure:             Pre-Anesthesia Assessment:                        - Prior to the procedure, a History and Physical was                         performed, and patient medications and allergies were                         reviewed. The patient's tolerance of previous                         anesthesia was also reviewed. The risks and benefits                         of the procedure and the sedation options and risks                         were discussed with the patient. All questions were                         answered, and informed consent was obtained. Prior                         Anticoagulants: The patient has taken no previous                         anticoagulant or antiplatelet agents. ASA Grade                         Assessment: II - A patient with mild systemic disease.                         After reviewing the risks and benefits, the patient                         was deemed in satisfactory condition to undergo the                         procedure.  After obtaining informed consent, the colonoscope was                         passed under direct vision. Throughout the procedure,                         the patient's blood pressure, pulse, and oxygen                         saturations were monitored continuously. The was                          introduced through the anus and advanced to the the                         cecum, identified by appendiceal orifice and ileocecal                         valve. The colonoscopy was performed without                         difficulty. The patient tolerated the procedure well.                         The quality of the bowel preparation was excellent. Findings:      The perianal and digital rectal examinations were normal.      Multiple small-mouthed diverticula were found in the entire colon.      Non-bleeding internal hemorrhoids were found during retroflexion. The       hemorrhoids were Grade I (internal hemorrhoids that do not prolapse). Impression:            - Diverticulosis in the entire examined colon.                        - Non-bleeding internal hemorrhoids.                        - No specimens collected. Recommendation:        - Discharge patient to home.                        - Resume previous diet.                        - Continue present medications. Procedure Code(s):     --- Professional ---                        [phone], Colonoscopy, flexible; diagnostic, including                         collection of specimen(s) by brushing or washing, when                         performed (separate procedure) Diagnosis Code(s):     --- Professional ---                        Z86.010, Personal history of colonic polyps CPT copyright 2019 American Medical Association. All rights reserved. The codes documented  in this report are preliminary and upon coder review may  be revised to meet current compliance requirements. [name] MD, MD 09/19/2020 10:44:47 AM This report has been signed electronically. Number of Addenda: 0 Note Initiated On: 09/19/2020 10:16 AM Scope Withdrawal Time: 0 hours 6 minutes 7 seconds  Total Procedure Duration: 0 hours 12 minutes 53 seconds  Estimated Blood Loss:  Estimated blood loss: none.      [facility]

## 2020-09-19 NOTE — Anesthesia Preprocedure Evaluation (Signed)
Anesthesia Evaluation  Patient identified by MRN, date of birth, ID band Patient awake    Reviewed: Allergy & Precautions, H&P , NPO status , Patient's Chart, lab work & pertinent test results, reviewed documented beta blocker date and time   Airway Mallampati: II  TM Distance: >3 FB Neck ROM: full    Dental no notable dental hx.    Pulmonary shortness of breath, COPD, former smoker,    Pulmonary exam normal breath sounds clear to auscultation       Cardiovascular Exercise Tolerance: Good hypertension,  Rhythm:regular Rate:Normal     Neuro/Psych Anxiety  Neuromuscular disease (peripheral neuropathy)    GI/Hepatic Neg liver ROS, GERD  ,  Endo/Other  negative endocrine ROSdiabetes  Renal/GU negative Renal ROS  negative genitourinary   Musculoskeletal   Abdominal   Peds  Hematology negative hematology ROS (+)   Anesthesia Other Findings   Reproductive/Obstetrics negative OB ROS                            Anesthesia Physical Anesthesia Plan  ASA: II  Anesthesia Plan: General   Post-op Pain Management:    Induction:   PONV Risk Score and Plan:   Airway Management Planned:   Additional Equipment:   Intra-op Plan:   Post-operative Plan:   Informed Consent: I have reviewed the patients History and Physical, chart, labs and discussed the procedure including the risks, benefits and alternatives for the proposed anesthesia with the patient or authorized representative who has indicated his/her understanding and acceptance.     Dental Advisory Given  Plan Discussed with: CRNA  Anesthesia Plan Comments:         Anesthesia Quick Evaluation

## 2020-09-19 NOTE — Anesthesia Procedure Notes (Signed)
Performed by: Frederich Montilla, CRNA Pre-anesthesia Checklist: Patient identified, Emergency Drugs available, Suction available, Timeout performed and Patient being monitored Patient Re-evaluated:Patient Re-evaluated prior to induction Oxygen Delivery Method: Nasal cannula Placement Confirmation: positive ETCO2       

## 2020-09-19 NOTE — Interval H&P Note (Signed)
Sara Lame, MD [address] [place], [place] [address] Phone:[phone] Fax : [phone]  Primary Care Physician:  Barbaraann Boys, MD Primary Gastroenterologist:  Dr. Allen Norris  Pre-Procedure History & Physical: HPI:  Sara Atkinson is a 75 y.o. female is here for an endoscopy and colonoscopy.   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 Authorizing Provider  albuterol (PROVENTIL HFA;VENTOLIN HFA) 108 (90 Base) MCG/ACT inhaler Inhale 2 puffs into the lungs every 6 (six) hours as needed for wheezing or shortness of breath. 08/05/17  Yes Juline Patch, MD  amLODipine (NORVASC) 10 MG tablet Take 1 tablet (10 mg total) by mouth daily. 08/05/17 07/15/20 Yes Juline Patch, MD  aspirin 81 MG tablet Take 81 mg by mouth daily.   Yes [provider]  BIOTIN 5000 PO Take 1 capsule by mouth daily.   Yes [provider]  carvedilol (COREG) 25 MG tablet Take 1 tablet (25 mg total) by mouth 2 (two) times daily. 08/05/17 07/15/20 Yes Juline Patch, MD  Cholecalciferol (VITAMIN D3) 5000 UNITS CAPS Take 1 capsule by mouth daily.   Yes [provider]  glipiZIDE (GLIPIZIDE XL) 5 MG 24 hr tablet TAKE 1 TABLET BY MOUTH WITH BREAKFAST 03/22/18  Yes Juline Patch, MD  losartan (COZAAR) 100 MG tablet Take 100 mg by mouth daily. 09/02/20  Yes [provider]  lovastatin  (MEVACOR) 40 MG tablet Take 1 tablet (40 mg total) by mouth daily. 08/05/17  Yes Juline Patch, MD  magnesium oxide (MAG-OX) 400 (241.3 Mg) MG tablet Take 1 tablet by mouth 2 (two) times daily. 04/18/20  Yes [provider]  metFORMIN (GLUCOPHAGE-XR) 500 MG 24 hr tablet SMARTSIG:2 Tablet(s) By Mouth Every Evening 09/02/20  Yes [provider]  mupirocin ointment (BACTROBAN) 2 % Apply 1 application topically 2 (two) times daily. 04/04/17  Yes Juline Patch, MD  naproxen (NAPROSYN) 500 MG tablet Take 500 mg by mouth as needed. otc   Yes [provider]  Omega-3 300 MG CAPS Take 1 capsule by mouth daily. 08/05/17  Yes Juline Patch, MD  STIOLTO RESPIMAT 2.5-2.5 MCG/ACT AERS SMARTSIG:2 Puff(s) Via Inhaler Daily 09/11/20  Yes [provider]  traMADol (ULTRAM) 50 MG tablet Take 1 tablet (50 mg total) by mouth every 8 (eight) hours as needed. 07/15/20  Yes Cook, Jayce G, DO  vitamin B-12 (CYANOCOBALAMIN) 1000 MCG tablet Take 1,000 mcg by mouth daily.   Yes [provider]  glucose blood test strip One a day testing 05/26/16   Juline Patch, MD  losartan (COZAAR) 50 MG tablet TAKE 1 TABLET BY MOUTH ONCE DAILY Patient not taking: Reported on 09/17/2020 03/25/18   Juline Patch, MD  metFORMIN (GLUCOPHAGE) 1000 MG tablet Take 1  tablet (1,000 mg total) by mouth 2 (two) times daily. Patient not taking: Reported on 09/17/2020 08/05/17   Jones, Deanna C, MD  Na Sulfate-K Sulfate-Mg Sulf (SUPREP BOWEL PREP KIT) 17.5-3.13-1.6 GM/177ML SOLN Take 1 kit by mouth as directed. 09/15/20   Wohl, Darren, MD  pantoprazole (PROTONIX) 40 MG tablet Take 1 tablet (40 mg total) by mouth 2 (two) times daily. Patient taking differently: Take 40 mg by mouth daily. 09/15/20   Wohl, Darren, MD  traZODone (DESYREL) 50 MG tablet Take 0.5-1 tablets (25-50 mg total) by mouth at bedtime as needed for sleep. 08/05/17 07/15/20  Jones, Deanna C, MD    Allergies as of 09/15/2020 - Review Complete  09/15/2020  Allergen Reaction Noted   Metronidazole Anaphylaxis 06/16/2015   Penicillins Anaphylaxis and Hives 06/16/2015   Tetracyclines & related Hives 06/16/2015   Montelukast  02/27/2019    Family History  Problem Relation Age of Onset   Cancer Mother    Diabetes Mother    Diabetes Father    Diabetes Maternal Grandfather    Stroke Maternal Grandfather    Diabetes Paternal Grandfather    Breast cancer Neg Hx     Social History   Socioeconomic History   Marital status: Widowed    Spouse name: Not on file   Number of children: Not on file   Years of education: Not on file   Highest education level: Not on file  Occupational History   Not on file  Tobacco Use   Smoking status: Former Smoker    Quit date: 12/08/2011    Years since quitting: 8.7   Smokeless tobacco: Never Used  Vaping Use   Vaping Use: Never used  Substance and Sexual Activity   Alcohol use: No    Alcohol/week: 0.0 standard drinks   Drug use: No   Sexual activity: Never  Other Topics Concern   Not on file  Social History Narrative   Not on file   Social Determinants of Health   Financial Resource Strain: Not on file  Food Insecurity: Not on file  Transportation Needs: Not on file  Physical Activity: Not on file  Stress: Not on file  Social Connections: Not on file  Intimate Partner Violence: Not on file    Review of Systems: See HPI, otherwise negative ROS  Physical Exam: Ht 5' 7" (1.702 m)   Wt 86.2 kg   BMI 29.76 kg/m  General:   Alert,  pleasant and cooperative in NAD Head:  Normocephalic and atraumatic. Neck:  Supple; no masses or thyromegaly. Lungs:  Clear throughout to auscultation.    Heart:  Regular rate and rhythm. Abdomen:  Soft, nontender and nondistended. Normal bowel sounds, without guarding, and without rebound.   Neurologic:  Alert and  oriented x4;  grossly normal neurologically.  Impression/Plan: Rayanna S Saylor is here for an endoscopy and colonoscopy to be  performed for atypical chest pain and a history of adenomatous polyps on 2015   Risks, benefits, limitations, and alternatives regarding  endoscopy and colonoscopy have been reviewed with the patient.  Questions have been answered.  All parties agreeable.   Darren Wohl, MD  09/19/2020, 9:22 AM 

## 2020-09-19 NOTE — Anesthesia Postprocedure Evaluation (Signed)
Anesthesia Post Note  Patient: Sara Atkinson  Procedure(s) Performed: COLONOSCOPY WITH PROPOFOL (N/A Rectum) ESOPHAGOGASTRODUODENOSCOPY (EGD) WITH PROPOFOL (N/A Throat)     Patient location during evaluation: PACU Anesthesia Type: General Level of consciousness: awake and alert Pain management: pain level controlled Vital Signs Assessment: post-procedure vital signs reviewed and stable Respiratory status: spontaneous breathing, nonlabored ventilation, respiratory function stable and patient connected to nasal cannula oxygen Cardiovascular status: blood pressure returned to baseline and stable Postop Assessment: no apparent nausea or vomiting Anesthetic complications: no   No complications documented.  Alisa Graff

## 2020-09-22 ENCOUNTER — Ambulatory Visit
Admission: RE | Admit: 2020-09-22 | Discharge: 2020-09-22 | Disposition: A | Discharge status: Home / Self Care | Payer: Medicare Other | Source: Ambulatory Visit | Attending: Gastroenterology | Admitting: Gastroenterology

## 2020-09-22 ENCOUNTER — Encounter: Payer: Self-pay | Admitting: Gastroenterology

## 2020-09-22 ENCOUNTER — Other Ambulatory Visit: Payer: Self-pay

## 2020-09-22 DIAGNOSIS — R1084 Generalized abdominal pain: Secondary | ICD-10-CM | POA: Insufficient documentation

## 2020-09-23 LAB — SURGICAL PATHOLOGY

## 2020-09-25 ENCOUNTER — Other Ambulatory Visit: Payer: Self-pay

## 2020-09-25 ENCOUNTER — Ambulatory Visit
Admission: RE | Admit: 2020-09-25 | Discharge: 2020-09-25 | Disposition: A | Discharge status: Home / Self Care | Payer: Medicare Other | Source: Ambulatory Visit | Attending: Pediatrics | Admitting: Pediatrics

## 2020-09-25 DIAGNOSIS — Z1231 Encounter for screening mammogram for malignant neoplasm of breast: Secondary | ICD-10-CM | POA: Insufficient documentation

## 2020-09-29 ENCOUNTER — Telehealth: Payer: Self-pay

## 2020-09-29 NOTE — Telephone Encounter (Signed)
Pt notified of [place] and EGD results. Pt scheduled for a follow up on Monday, Feb 28th.

## 2020-09-29 NOTE — Telephone Encounter (Signed)
-----   Message from [name], MD sent at 09/28/2020  7:15 PM EST ----- That the patient know that her liver ultrasound showed her to have a fatty liver. There is no sign of cirrhosis or any sign of a mass.

## 2020-09-29 NOTE — Telephone Encounter (Signed)
-----   Message from [name], MD sent at 09/28/2020  7:13 PM EST ----- Please have the patient come in for a follow up.

## 2020-09-30 ENCOUNTER — Ambulatory Visit: Payer: Medicare Other | Admitting: Gastroenterology

## 2020-10-06 ENCOUNTER — Encounter: Payer: Self-pay | Admitting: Gastroenterology

## 2020-10-06 ENCOUNTER — Ambulatory Visit (INDEPENDENT_AMBULATORY_CARE_PROVIDER_SITE_OTHER): Payer: Medicare Other | Admitting: Gastroenterology

## 2020-10-06 ENCOUNTER — Other Ambulatory Visit: Payer: Self-pay

## 2020-10-06 VITALS — BP 115/67 | HR 77 | Temp 97.2°F | Ht 67.0 in | Wt 188.4 lb

## 2020-10-06 DIAGNOSIS — K31A0 Gastric intestinal metaplasia, unspecified: Secondary | ICD-10-CM

## 2020-10-06 DIAGNOSIS — K219 Gastro-esophageal reflux disease without esophagitis: Secondary | ICD-10-CM | POA: Diagnosis not present

## 2020-10-06 NOTE — Progress Notes (Signed)
Primary Care Physician: Barbaraann Boys, MD  Primary Gastroenterologist:  Dr. [name]  Chief Complaint  Patient presents with  . Follow-up    HPI: Sara Atkinson is a 75 y.o. female here who comes in today after having an EGD and colonoscopy.  The patient had biopsies of her antrum at areas that showed gastritis and appeared to be consistent with gastric intestinal metaplasia.  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  The patient's colonoscopy was also done at the same time and showed diverticulosis and internal hemorrhoids without any sign of polyps.  The patient now comes today to discuss the pathology results of her gastric biopsies. The patient reports that her PPIs working for her.  She has no goiter the present time.  Past Medical History:  Diagnosis Date  . Anxiety   . 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 COPD (chronic obstructive pulmonary disease) (H[facility])    mild  . Diabetes mellitus without complication ([place])   . Dyspnea    with activity  . GERD (gastroesophageal reflux disease)   . Hyperlipidemia   . Hypertension     Current Outpatient Medications  Medication Sig Dispense Refill  . albuterol (PROVENTIL HFA;VENTOLIN HFA) 108 (90 Base) MCG/ACT inhaler Inhale 2 puffs into the lungs every 6 (six) hours as needed for wheezing or shortness of breath. 1 Inhaler 11  . aspirin 81 MG tablet Take 81 mg by mouth daily.    Marland Kitchen BIOTIN 5000 PO Take 1 capsule by mouth daily.    . Cholecalciferol (VITAMIN D3) 5000 UNITS CAPS Take 1 capsule by mouth daily.    Marland Kitchen glipiZIDE (GLIPIZIDE XL) 5 MG 24 hr tablet TAKE 1 TABLET BY MOUTH WITH BREAKFAST 90 tablet 1  . glucose blood test strip One a day testing 100 each 1  . losartan (COZAAR) 100 MG tablet Take 100 mg by mouth daily.    Marland Kitchen lovastatin (MEVACOR) 40 MG tablet Take 1 tablet (40 mg total) by mouth daily. 90 tablet 1  . magnesium oxide (MAG-OX) 400 (241.3 Mg) MG tablet Take 1 tablet by mouth 2 (two) times daily.     . metFORMIN (GLUCOPHAGE-XR) 500 MG 24 hr tablet SMARTSIG:2 Tablet(s) By Mouth Every Evening    . mupirocin ointment (BACTROBAN) 2 % Apply 1 application topically 2 (two) times daily. 22 g 0  . Na Sulfate-K Sulfate-Mg Sulf (SUPREP BOWEL PREP KIT) 17.5-3.13-1.6 GM/177ML SOLN Take 1 kit by mouth as directed. 354 mL 0  . naproxen (NAPROSYN) 500 MG tablet Take 500 mg by mouth as needed. otc    . Omega-3 300 MG CAPS Take 1 capsule by mouth daily. 60 capsule 11  . pantoprazole (PROTONIX) 40 MG tablet Take 1 tablet (40 mg total) by mouth 2 (two) times daily. (Patient taking differently: Take 40 mg by mouth daily.) 60 tablet 6  . STIOLTO RESPIMAT 2.5-2.5 MCG/ACT AERS SMARTSIG:2 Puff(s) Via Inhaler Daily    . traMADol (ULTRAM) 50 MG tablet Take 1 tablet (50 mg total) by mouth every 8 (eight) hours as needed. 15 tablet 0  . vitamin B-12 (CYANOCOBALAMIN) 1000 MCG tablet Take 1,000 mcg by mouth daily.    Marland Kitchen amLODipine (NORVASC) 10 MG tablet Take 1 tablet (10 mg total) by mouth daily. 90 tablet 1  . carvedilol (COREG) 25 MG tablet Take 1 tablet (25 mg total) by mouth 2 (two) times daily. 180 tablet 1  . losartan (COZAAR) 50 MG tablet TAKE 1 TABLET BY MOUTH ONCE  DAILY (Patient not taking: No sig reported) 90 tablet 0  . metFORMIN (GLUCOPHAGE) 1000 MG tablet Take 1 tablet (1,000 mg total) by mouth 2 (two) times daily. (Patient not taking: No sig reported) 180 tablet 1   No current facility-administered medications for this visit.    Allergies as of 10/06/2020 - Review Complete 10/06/2020  Allergen Reaction Noted  . Metronidazole Anaphylaxis 06/16/2015  . Penicillins Anaphylaxis and Hives 06/16/2015  . Tetracyclines & related Hives 06/16/2015  . Montelukast  02/27/2019    ROS:  General: Negative for anorexia, weight loss, fever, chills, fatigue, weakness. ENT: Negative for hoarseness, difficulty swallowing , nasal congestion. CV: Negative for chest pain, angina, palpitations, dyspnea on exertion,  peripheral edema.  Respiratory: Negative for dyspnea at rest, dyspnea on exertion, cough, sputum, wheezing.  GI: See history of present illness. GU:  Negative for dysuria, hematuria, urinary incontinence, urinary frequency, nocturnal urination.  Endo: Negative for unusual weight change.    Physical Examination:   BP 115/67   Pulse 77   Temp (!) 97.2 F (36.2 C) (Temporal)   Ht _0  (1.702 m)   Wt 188 lb 6.4 oz (85.5 kg)   BMI 29.51 kg/m   General: Well-nourished, well-developed in no acute distress.  Eyes: No icterus. Conjunctivae pink. Neuro: Alert and oriented x 3.  Grossly intact. Skin: Warm and dry, no jaundice.   Psych: Alert and cooperative, normal mood and affect.  Labs:    Imaging Studies: US Abdomen Complete  Result Date: 09/22/2020 CLINICAL DATA:  Generalized abdominal pain for 3 months. EXAM: ABDOMEN ULTRASOUND COMPLETE COMPARISON:  None. FINDINGS: Gallbladder: Status post cholecystectomy. Common bile duct: Diameter: 5 mm which is within normal limits. Liver: No focal lesion identified. Increased echogenicity of hepatic parenchyma is noted suggesting hepatic steatosis or other diffuse hepatocellular disease. Portal vein is patent on color Doppler imaging with normal direction of blood flow towards the liver. IVC: No abnormality visualized. Pancreas: Visualized portion unremarkable. Spleen: Size and appearance within normal limits. Right Kidney: Length: 12.5 cm. Echogenicity within normal limits. No mass or hydronephrosis visualized. Left Kidney: Length: 11.8 cm. 1.1 cm simple cyst is noted in midpole. Echogenicity within normal limits. No mass or hydronephrosis visualized. Abdominal aorta: No aneurysm visualized. Other findings: None. IMPRESSION: Status post cholecystectomy. Increased echogenicity of hepatic parenchyma is noted suggesting hepatic steatosis or other diffuse hepatocellular disease. No other significant abnormality seen in the abdomen. Electronically Signed   By:  Marijo Conception M.D.   On: 09/22/2020 15:42   MM 3D SCREEN BREAST BILATERAL  Result Date: 09/30/2020 CLINICAL DATA:  Screening. EXAM: DIGITAL SCREENING BILATERAL MAMMOGRAM WITH TOMOSYNTHESIS AND CAD TECHNIQUE: Bilateral screening digital craniocaudal and mediolateral oblique mammograms were obtained. Bilateral screening digital breast tomosynthesis was performed. The images were evaluated with computer-aided detection. COMPARISON:  Previous exam(s). ACR Breast Density Category a: The breast tissue is almost entirely fatty. FINDINGS: There are no findings suspicious for malignancy. IMPRESSION: No mammographic evidence of malignancy. A result letter of this screening mammogram will be mailed directly to the patient. RECOMMENDATION: Screening mammogram in one year. (Code:SM-B-01Y) BI-RADS CATEGORY  1: Negative. Electronically Signed   By: Valentino Saxon MD   On: 09/30/2020 16:36    Assessment and Plan:   Sara Atkinson is a 75 y.o. y/o female who comes today for follow-up after having an EGD and colonoscopy.  The patient has been told that she does not need any further colonoscopies and has been told about the intestinal metaplasia found  in her stomach. She has also been told her that due to her age of 6, I will not plan to repeat the upper endoscopy in 3 years unless at that time she finds herself to be in good health and doing well. The patient has been explained the plan and agrees with it.     [name], MD. Marval Regal    Note: This dictation was prepared with Dragon dictation along with smaller phrase technology. Any transcriptional errors that result from this process are unintentional.

## 2020-12-30 ENCOUNTER — Other Ambulatory Visit: Payer: Self-pay | Admitting: Gastroenterology

## 2021-03-09 DIAGNOSIS — I209 Angina pectoris, unspecified: Secondary | ICD-10-CM | POA: Diagnosis present

## 2021-03-17 ENCOUNTER — Other Ambulatory Visit: Payer: Self-pay

## 2021-03-17 ENCOUNTER — Encounter
Admission: RE | Disposition: A | Discharge status: Home / Self Care | Payer: Self-pay | Source: Ambulatory Visit | Attending: Internal Medicine

## 2021-03-17 ENCOUNTER — Ambulatory Visit
Admission: RE | Admit: 2021-03-17 | Discharge: 2021-03-17 | Disposition: A | Discharge status: Home / Self Care | Payer: Medicare Other | Source: Ambulatory Visit | Attending: Internal Medicine | Admitting: Internal Medicine

## 2021-03-17 DIAGNOSIS — I25110 Atherosclerotic heart disease of native coronary artery with unstable angina pectoris: Secondary | ICD-10-CM | POA: Insufficient documentation

## 2021-03-17 DIAGNOSIS — E119 Type 2 diabetes mellitus without complications: Secondary | ICD-10-CM | POA: Insufficient documentation

## 2021-03-17 DIAGNOSIS — I10 Essential (primary) hypertension: Secondary | ICD-10-CM | POA: Diagnosis not present

## 2021-03-17 DIAGNOSIS — R079 Chest pain, unspecified: Secondary | ICD-10-CM

## 2021-03-17 DIAGNOSIS — I209 Angina pectoris, unspecified: Secondary | ICD-10-CM | POA: Diagnosis present

## 2021-03-17 DIAGNOSIS — E785 Hyperlipidemia, unspecified: Secondary | ICD-10-CM | POA: Insufficient documentation

## 2021-03-17 HISTORY — PX: INTRAVASCULAR PRESSURE WIRE/FFR STUDY: CATH118243

## 2021-03-17 HISTORY — PX: LEFT HEART CATH AND CORONARY ANGIOGRAPHY: CATH118249

## 2021-03-17 LAB — POCT ACTIVATED CLOTTING TIME: Activated Clotting Time: 254 seconds

## 2021-03-17 LAB — GLUCOSE, CAPILLARY: Glucose-Capillary: 182 mg/dL — ABNORMAL HIGH (ref 70–99)

## 2021-03-17 SURGERY — LEFT HEART CATH AND CORONARY ANGIOGRAPHY
Anesthesia: Moderate Sedation

## 2021-03-17 MED ORDER — LIDOCAINE HCL (PF) 1 % IJ SOLN
INTRAMUSCULAR | Status: DC | PRN
  Administered 2021-03-17: 2 mL

## 2021-03-17 MED ORDER — FENTANYL CITRATE (PF) 100 MCG/2ML IJ SOLN
INTRAMUSCULAR | Status: DC | PRN
  Administered 2021-03-17: 25 ug via INTRAVENOUS

## 2021-03-17 MED ORDER — ACETAMINOPHEN 325 MG PO TABS: 650.0000 mg | ORAL_TABLET | ORAL | Status: DC | PRN

## 2021-03-17 MED ORDER — VERAPAMIL HCL 2.5 MG/ML IV SOLN
INTRAVENOUS | Status: AC
  Filled 2021-03-17: qty 2

## 2021-03-17 MED ORDER — HEPARIN (PORCINE) IN NACL 2000-0.9 UNIT/L-% IV SOLN
INTRAVENOUS | Status: DC | PRN
Start: 2021-03-17 — End: 2021-03-17
  Administered 2021-03-17: 1000 mL

## 2021-03-17 MED ORDER — HEPARIN (PORCINE) IN NACL 1000-0.9 UT/500ML-% IV SOLN
INTRAVENOUS | Status: AC
  Filled 2021-03-17: qty 1000

## 2021-03-17 MED ORDER — SODIUM CHLORIDE 0.9% FLUSH: 3.0000 mL | INTRAVENOUS | Status: DC | PRN

## 2021-03-17 MED ORDER — HYDRALAZINE HCL 20 MG/ML IJ SOLN: 10.0000 mg | INTRAMUSCULAR | Status: DC | PRN

## 2021-03-17 MED ORDER — SODIUM CHLORIDE 0.9 % WEIGHT BASED INFUSION: 1.0000 mL/kg/h | INTRAVENOUS | Status: DC

## 2021-03-17 MED ORDER — LABETALOL HCL 5 MG/ML IV SOLN: 10.0000 mg | INTRAVENOUS | Status: DC | PRN

## 2021-03-17 MED ORDER — MIDAZOLAM HCL 2 MG/2ML IJ SOLN
INTRAMUSCULAR | Status: DC | PRN
  Administered 2021-03-17: 1 mg via INTRAVENOUS

## 2021-03-17 MED ORDER — IOHEXOL 300 MG/ML  SOLN
INTRAMUSCULAR | Status: DC | PRN
  Administered 2021-03-17: 75 mL

## 2021-03-17 MED ORDER — SODIUM CHLORIDE 0.9% FLUSH: 3.0000 mL | Freq: Two times a day (BID) | INTRAVENOUS | Status: DC

## 2021-03-17 MED ORDER — IOHEXOL 300 MG/ML  SOLN
INTRAMUSCULAR | Status: DC | PRN
  Administered 2021-03-17: 26 mL via INTRA_ARTERIAL

## 2021-03-17 MED ORDER — SODIUM CHLORIDE 0.9 % IV SOLN: 250.0000 mL | INTRAVENOUS | Status: DC | PRN

## 2021-03-17 MED ORDER — SODIUM CHLORIDE 0.9 % WEIGHT BASED INFUSION: 250.0000 mL/h | INTRAVENOUS | Status: AC

## 2021-03-17 MED ORDER — FENTANYL CITRATE (PF) 100 MCG/2ML IJ SOLN
INTRAMUSCULAR | Status: AC
  Filled 2021-03-17: qty 2

## 2021-03-17 MED ORDER — VERAPAMIL HCL 2.5 MG/ML IV SOLN
INTRAVENOUS | Status: DC | PRN
  Administered 2021-03-17: 2.5 mg via INTRAVENOUS

## 2021-03-17 MED ORDER — HEPARIN SODIUM (PORCINE) 1000 UNIT/ML IJ SOLN
INTRAMUSCULAR | Status: DC | PRN
  Administered 2021-03-17 (×2): 4000 [IU] via INTRAVENOUS

## 2021-03-17 MED ORDER — LIDOCAINE HCL 1 % IJ SOLN
INTRAMUSCULAR | Status: AC
  Filled 2021-03-17: qty 20

## 2021-03-17 MED ORDER — MIDAZOLAM HCL 2 MG/2ML IJ SOLN
INTRAMUSCULAR | Status: AC
  Filled 2021-03-17: qty 2

## 2021-03-17 MED ORDER — ASPIRIN 81 MG PO CHEW: 81.0000 mg | CHEWABLE_TABLET | ORAL | Status: AC

## 2021-03-17 MED ORDER — HEPARIN SODIUM (PORCINE) 1000 UNIT/ML IJ SOLN
INTRAMUSCULAR | Status: AC
  Filled 2021-03-17: qty 1

## 2021-03-17 MED ORDER — SODIUM CHLORIDE 0.9 % WEIGHT BASED INFUSION
1.0000 mL/kg/h | INTRAVENOUS | Status: DC
  Administered 2021-03-17: 1 mL/kg/h via INTRAVENOUS

## 2021-03-17 MED ORDER — ASPIRIN 81 MG PO CHEW
CHEWABLE_TABLET | ORAL | Status: AC
  Administered 2021-03-17: 81 mg via ORAL
  Filled 2021-03-17: qty 1

## 2021-03-17 MED ORDER — ONDANSETRON HCL 4 MG/2ML IJ SOLN: 4.0000 mg | Freq: Four times a day (QID) | INTRAMUSCULAR | Status: DC | PRN

## 2021-03-17 SURGICAL SUPPLY — 16 items
CATH INFINITI 5 FR JL3.5 (CATHETERS) ×2 IMPLANT
CATH INFINITI JR4 5F (CATHETERS) ×1 IMPLANT
CATH LAUNCHER 6FR EBU 3 (CATHETERS) ×1 IMPLANT
DEVICE RAD COMP TR BAND LRG (VASCULAR PRODUCTS) ×2 IMPLANT
DRAPE BRACHIAL (DRAPES) ×1 IMPLANT
GLIDESHEATH SLEND SS 6F .021 (SHEATH) ×3 IMPLANT
GUIDEWIRE INQWIRE 1.5J.035X260 (WIRE) IMPLANT
GUIDEWIRE PRESS OMNI 185 ST (WIRE) ×2 IMPLANT
INQWIRE 1.5J .035X260CM (WIRE) ×3
PACK CARDIAC CATH (CUSTOM PROCEDURE TRAY) ×3 IMPLANT
PROTECTION STATION PRESSURIZED (MISCELLANEOUS) ×3
SET ATX SIMPLICITY (MISCELLANEOUS) ×2 IMPLANT
STATION PROTECTION PRESSURIZED (MISCELLANEOUS) IMPLANT
TUBING CIL FLEX 10 FLL-RA (TUBING) ×2 IMPLANT
VALVE COPILOT STAT (MISCELLANEOUS) ×2 IMPLANT
WIRE G INSERTION TOOL (WIRE) ×1 IMPLANT

## 2021-03-17 NOTE — OR Nursing (Signed)
PT's labs noted to be greater than 3 days old. Asked Dr. Nehemiah Massed if he would like new labs and he declined to order new labs if the ones from 7/5 were not abnormal. No new labs will be obtained at this time.

## 2021-03-17 NOTE — Progress Notes (Signed)
[facility] Encounter Note  Patient: Sara Atkinson / Admit Date: 03/17/2021 / Date of Encounter: 03/17/2021, 1:14 PM   Subjective: 75 year old female with progressive anginal symptoms hypertension hyperlipidemia and known coronary artery disease with abnormal EKG consistent with unstable angina on appropriate medication management including carvedilol amlodipine losartan lovastatin and isosorbide needing further treatment.  We have discussed at length cardiac catheterization to assess coronary anatomy and further treatment thereof is necessary and she still is understanding of the risks and benefits of cardiac catheterization   Objective: Telemetry: Normal sinus rhythm Physical Exam: Blood pressure 130/77, pulse 66, temperature 98.4 F (36.9 C), resp. rate 13, height '5\' 6"'$  (1.676 m), weight 83.9 kg, SpO2 95 %. Body mass index is 29.86 kg/m. General: Well developed, well nourished, in no acute distress. Head: Normocephalic, atraumatic, sclera non-icteric, no xanthomas, nares are without discharge. Neck: No apparent masses Lungs: Normal respirations with no wheezes, no rhonchi, no rales , no crackles   Heart: Regular rate and rhythm, normal S1 S2, no murmur, no rub, no gallop, PMI is normal size and placement, carotid upstroke normal without bruit, jugular venous pressure normal Abdomen: Soft, non-tender, non-distended with normoactive bowel sounds. No hepatosplenomegaly. Abdominal aorta is normal size without bruit Extremities: No edema, no clubbing, no cyanosis, no ulcers,  Peripheral: 2+ radial, 2+ femoral, 2+ dorsal pedal pulses Neuro: Alert and oriented. Moves all extremities spontaneously. Psych:  Responds to questions appropriately with a normal affect.  No intake or output data in the 24 hours ending 03/17/21 1314  Inpatient Medications:   sodium chloride flush  3 mL Intravenous Q12H   Infusions:   sodium chloride     sodium chloride      Labs: No results  for input(s): NA, K, CL, CO2, GLUCOSE, BUN, CREATININE, CALCIUM, MG, PHOS in the last 72 hours. No results for input(s): AST, ALT, ALKPHOS, BILITOT, PROT, ALBUMIN in the last 72 hours. No results for input(s): WBC, NEUTROABS, HGB, HCT, MCV, PLT in the last 72 hours. No results for input(s): CKTOTAL, CKMB, TROPONINI in the last 72 hours. Invalid input(s): POCBNP No results for input(s): HGBA1C in the last 72 hours.   Weights: Filed Weights   03/17/21 1101  Weight: 83.9 kg     Radiology/Studies:  No results found.   Assessment and Recommendation  75 y.o. female with progressive as she does of unstable angina with diabetes hypertension hyperlipidemia needing further evaluation by cardiac catheterization.  Patient understands risk and benefits of cardiac catheterization.  This includes a possibility of death stroke heart attack infection bleeding or blood clot.  She is at low risk for conscious sedation  Signed, Serafina Royals M.D. FACC

## 2021-03-18 ENCOUNTER — Encounter: Payer: Self-pay | Admitting: Internal Medicine

## 2021-03-24 ENCOUNTER — Other Ambulatory Visit: Payer: Self-pay | Admitting: Family Medicine

## 2021-11-09 ENCOUNTER — Other Ambulatory Visit: Payer: Self-pay | Admitting: Pediatrics

## 2021-11-09 DIAGNOSIS — Z1231 Encounter for screening mammogram for malignant neoplasm of breast: Secondary | ICD-10-CM

## 2021-11-09 DIAGNOSIS — Z780 Asymptomatic menopausal state: Secondary | ICD-10-CM

## 2021-12-22 ENCOUNTER — Ambulatory Visit: Payer: Medicare Other

## 2021-12-28 ENCOUNTER — Ambulatory Visit: Admission: RE | Admit: 2021-12-28 | Payer: Medicare Other | Source: Ambulatory Visit

## 2021-12-28 ENCOUNTER — Ambulatory Visit
Admission: RE | Admit: 2021-12-28 | Discharge: 2021-12-28 | Disposition: A | Discharge status: Home / Self Care | Payer: Medicare Other | Source: Ambulatory Visit | Attending: Pediatrics | Admitting: Pediatrics

## 2021-12-28 DIAGNOSIS — Z780 Asymptomatic menopausal state: Secondary | ICD-10-CM | POA: Insufficient documentation

## 2022-02-03 ENCOUNTER — Ambulatory Visit
Admission: RE | Admit: 2022-02-03 | Discharge: 2022-02-03 | Disposition: A | Discharge status: Home / Self Care | Payer: Medicare Other | Source: Ambulatory Visit | Attending: Pediatrics | Admitting: Pediatrics

## 2022-02-03 DIAGNOSIS — Z1231 Encounter for screening mammogram for malignant neoplasm of breast: Secondary | ICD-10-CM | POA: Diagnosis present
